# Patient Record
Sex: Female | Born: 1980 | Race: Black or African American | Hispanic: No | Smoking: Former smoker
Health system: Southern US, Community
[De-identification: ages and names within clinical notes are randomized; demographics above are authoritative.]

## PROBLEM LIST (undated history)

## (undated) ENCOUNTER — Inpatient Hospital Stay (HOSPITAL_COMMUNITY): Payer: Self-pay

## (undated) DIAGNOSIS — F329 Major depressive disorder, single episode, unspecified: Secondary | ICD-10-CM

## (undated) DIAGNOSIS — R87619 Unspecified abnormal cytological findings in specimens from cervix uteri: Secondary | ICD-10-CM

## (undated) DIAGNOSIS — N83209 Unspecified ovarian cyst, unspecified side: Secondary | ICD-10-CM

## (undated) DIAGNOSIS — F32A Depression, unspecified: Secondary | ICD-10-CM

## (undated) DIAGNOSIS — IMO0002 Reserved for concepts with insufficient information to code with codable children: Secondary | ICD-10-CM

## (undated) DIAGNOSIS — O009 Unspecified ectopic pregnancy without intrauterine pregnancy: Secondary | ICD-10-CM

## (undated) HISTORY — PX: DILATION AND CURETTAGE OF UTERUS: SHX78

## (undated) HISTORY — PX: APPENDECTOMY: SHX54

## (undated) HISTORY — PX: LAPAROTOMY: SHX154

## (undated) HISTORY — PX: WISDOM TOOTH EXTRACTION: SHX21

## (undated) HISTORY — PX: HAND SURGERY: SHX662

## (undated) HISTORY — PX: INDUCED ABORTION: SHX677

---
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
## 2007-04-08 ENCOUNTER — Emergency Department (HOSPITAL_COMMUNITY): Admission: EM | Admit: 2007-04-08 | Discharge: 2007-04-08 | Payer: Self-pay | Admitting: Emergency Medicine

## 2007-10-20 ENCOUNTER — Emergency Department (HOSPITAL_COMMUNITY): Admission: EM | Admit: 2007-10-20 | Discharge: 2007-10-20 | Payer: Self-pay | Admitting: Emergency Medicine

## 2009-07-19 ENCOUNTER — Emergency Department (HOSPITAL_COMMUNITY): Admission: EM | Admit: 2009-07-19 | Discharge: 2009-07-19 | Payer: Self-pay | Admitting: Emergency Medicine

## 2009-07-22 ENCOUNTER — Emergency Department (HOSPITAL_BASED_OUTPATIENT_CLINIC_OR_DEPARTMENT_OTHER): Admission: EM | Admit: 2009-07-22 | Discharge: 2009-07-22 | Payer: Self-pay | Admitting: Emergency Medicine

## 2009-07-22 ENCOUNTER — Ambulatory Visit: Payer: Self-pay | Admitting: Interventional Radiology

## 2010-01-24 ENCOUNTER — Ambulatory Visit (HOSPITAL_BASED_OUTPATIENT_CLINIC_OR_DEPARTMENT_OTHER)
Admission: RE | Admit: 2010-01-24 | Discharge: 2010-01-24 | Payer: Self-pay | Source: Home / Self Care | Admitting: Family Medicine

## 2010-01-24 ENCOUNTER — Ambulatory Visit: Payer: Self-pay | Admitting: Diagnostic Radiology

## 2011-03-13 LAB — POCT URINALYSIS DIP (DEVICE)
Nitrite: NEGATIVE
Urobilinogen, UA: 0.2
pH: 5.5

## 2011-03-13 LAB — URINE CULTURE

## 2011-03-13 LAB — WET PREP, GENITAL
Trich, Wet Prep: NONE SEEN
Yeast Wet Prep HPF POC: NONE SEEN

## 2012-04-20 ENCOUNTER — Inpatient Hospital Stay (HOSPITAL_COMMUNITY)
Admission: AD | Admit: 2012-04-20 | Discharge: 2012-04-20 | Disposition: A | Discharge status: Home / Self Care | Payer: Medicaid Other | Source: Ambulatory Visit | Attending: Obstetrics and Gynecology | Admitting: Obstetrics and Gynecology

## 2012-04-20 ENCOUNTER — Encounter (HOSPITAL_COMMUNITY): Payer: Self-pay | Admitting: *Deleted

## 2012-04-20 ENCOUNTER — Inpatient Hospital Stay (HOSPITAL_COMMUNITY): Payer: Medicaid Other

## 2012-04-20 DIAGNOSIS — Z3490 Encounter for supervision of normal pregnancy, unspecified, unspecified trimester: Secondary | ICD-10-CM

## 2012-04-20 DIAGNOSIS — R109 Unspecified abdominal pain: Secondary | ICD-10-CM | POA: Insufficient documentation

## 2012-04-20 DIAGNOSIS — O99891 Other specified diseases and conditions complicating pregnancy: Secondary | ICD-10-CM | POA: Insufficient documentation

## 2012-04-20 DIAGNOSIS — R1013 Epigastric pain: Secondary | ICD-10-CM

## 2012-04-20 HISTORY — DX: Unspecified ovarian cyst, unspecified side: N83.209

## 2012-04-20 HISTORY — DX: Unspecified abnormal cytological findings in specimens from cervix uteri: R87.619

## 2012-04-20 HISTORY — DX: Reserved for concepts with insufficient information to code with codable children: IMO0002

## 2012-04-20 HISTORY — DX: Unspecified ectopic pregnancy without intrauterine pregnancy: O00.90

## 2012-04-20 HISTORY — DX: Major depressive disorder, single episode, unspecified: F32.9

## 2012-04-20 HISTORY — DX: Depression, unspecified: F32.A

## 2012-04-20 LAB — URINALYSIS, ROUTINE W REFLEX MICROSCOPIC
Glucose, UA: NEGATIVE mg/dL
Leukocytes, UA: NEGATIVE
Protein, ur: NEGATIVE mg/dL
pH: 8.5 — ABNORMAL HIGH (ref 5.0–8.0)

## 2012-04-20 LAB — URINE MICROSCOPIC-ADD ON

## 2012-04-20 LAB — POCT PREGNANCY, URINE: Preg Test, Ur: POSITIVE — AB

## 2012-04-20 LAB — CBC
MCV: 71.2 fL — ABNORMAL LOW (ref 78.0–100.0)
WBC: 6.3 10*3/uL (ref 4.0–10.5)

## 2012-04-20 MED ORDER — RANITIDINE HCL 150 MG PO CAPS: 150.0000 mg | ORAL_CAPSULE | Freq: Every day | ORAL | Status: DC

## 2012-04-20 MED ORDER — GI COCKTAIL ~~LOC~~
30.0000 mL | Freq: Once | ORAL | Status: AC
  Administered 2012-04-20: 30 mL via ORAL
  Filled 2012-04-20: qty 30

## 2012-04-20 NOTE — MAU Provider Note (Signed)
Attestation of Attending Supervision of Advanced Practitioner: Evaluation and management procedures were performed by the PA/NP/CNM/OB Fellow under my supervision/collaboration. Chart reviewed and agree with management and plan.  Cora Brierley V 04/20/2012 10:33 PM

## 2012-04-20 NOTE — MAU Note (Signed)
Upper abd pain, started a few days ago.  Not always related to eating; sometimes wakes her up..  Pain is making her nauseated.

## 2012-04-20 NOTE — MAU Provider Note (Signed)
History     CSN: [id number]  Arrival date and time: 04/20/12 1535   First Provider Initiated Contact with Patient 04/20/12 1907      Chief Complaint  Patient presents with  . Abdominal Pain   HPI Christina Greene is a 31 y.o. [phone] at [redacted]w[redacted]d. She presents with c/o upper mid abd pain x 3 days. Pain last ~ 5 seconds, occurs every 3-5 minutes. Has nausea with it, it wakes her up early am. She vomited x 1 last evening due to the pain.   No other c/o voiced.    Past Medical History  Diagnosis Date  . Ectopic pregnancy   . Ovarian cyst   . Abnormal Pap smear     colpo ok  . Depression     doing ok    Past Surgical History  Procedure Date  . Dilation and curettage of uterus   . Laparotomy     ruptured ectopic, rt tube; ovarian cyst    Family History  Problem Relation Age of Onset  . Hypertension Mother   . Hypothyroidism Mother   . Diabetes Father   . Liver disease Father     hepatitis  . Other Neg Hx     History  Substance Use Topics  . Smoking status: Current Every Day Smoker -- 4 years  . Smokeless tobacco: Not on file     Comment: black and milds  . Alcohol Use: Yes     Comment: occ    Allergies: No Known Allergies  Prescriptions prior to admission  Medication Sig Dispense Refill  . aluminum & magnesium hydroxide-simethicone (MYLANTA) 500-450-40 MG/5ML suspension Take 5 mLs by mouth every 6 (six) hours as needed. Heart burn        Review of Systems  Constitutional: Negative for fever and chills.  Respiratory: Negative for cough.   Cardiovascular: Negative for chest pain.  Gastrointestinal: Positive for nausea, vomiting and abdominal pain. Negative for diarrhea and constipation.  Genitourinary: Negative for dysuria, urgency and frequency.   Physical Exam   Blood pressure 126/81, pulse 92, temperature 98.6 F (37 C), temperature source Oral, resp. rate 20, height 5\' 6"  (1.676 m), weight 176 lb (79.833 kg), last menstrual period  03/14/2012.  Physical Exam  Constitutional: She is oriented to person, place, and time. She appears well-developed and well-nourished.  GI: Soft. She exhibits no distension and no mass. There is tenderness. There is no rebound and no guarding.       Mild epigastric pain  Musculoskeletal: Normal range of motion.  Neurological: She is alert and oriented to person, place, and time.  Skin: Skin is warm and dry.  Psychiatric: She has a normal mood and affect. Her behavior is normal.    MAU Course  Procedures  MDM Results for orders placed during the hospital encounter of 04/20/12 (from the past 24 hour(s))  URINALYSIS, ROUTINE W REFLEX MICROSCOPIC     Status: Abnormal   Collection Time   04/20/12  2:20 PM      Component Value Range   Color, Urine YELLOW  YELLOW   APPearance CLEAR  CLEAR   Specific Gravity, Urine 1.015  1.005 - 1.030   pH 8.5 (*) 5.0 - 8.0   Glucose, UA NEGATIVE  NEGATIVE mg/dL   Hgb urine dipstick TRACE (*) NEGATIVE   Bilirubin Urine NEGATIVE  NEGATIVE   Ketones, ur NEGATIVE  NEGATIVE mg/dL   Protein, ur NEGATIVE  NEGATIVE mg/dL   Urobilinogen, UA 0.2  0.0 - 1.0  mg/dL   Nitrite NEGATIVE  NEGATIVE   Leukocytes, UA NEGATIVE  NEGATIVE  URINE MICROSCOPIC-ADD ON     Status: Abnormal   Collection Time   04/20/12  2:20 PM      Component Value Range   Squamous Epithelial / LPF FEW (*) RARE   WBC, UA 0-2  <3 WBC/hpf   RBC / HPF 0-2  <3 RBC/hpf   Bacteria, UA FEW (*) RARE  POCT PREGNANCY, URINE     Status: Abnormal   Collection Time   04/20/12  4:37 PM      Component Value Range   Preg Test, Ur POSITIVE (*) NEGATIVE  ABO/RH     Status: Normal   Collection Time   04/20/12  4:58 PM      Component Value Range   ABO/RH(D) O POS    HCG, QUANTITATIVE, PREGNANCY     Status: Abnormal   Collection Time   04/20/12  4:58 PM      Component Value Range   hCG, Beta Chain, Quant, S 4484 (*) <5 mIU/mL  CBC     Status: Abnormal   Collection Time   04/20/12  4:59 PM       Component Value Range   WBC 6.3  4.0 - 10.5 K/uL   RBC 5.52 (*) 3.87 - 5.11 MIL/uL   Hemoglobin 12.9  12.0 - 15.0 g/dL   HCT 16.1  09.6 - 04.5 %   MCV 71.2 (*) 78.0 - 100.0 fL   MCH 23.4 (*) 26.0 - 34.0 pg   MCHC 32.8  30.0 - 36.0 g/dL   RDW 40.9  81.1 - 91.4 %   Platelets 238  150 - 400 K/uL   US Ob Comp Less 14 Wks  04/20/2012  *RADIOLOGY REPORT*  Clinical Data: Pregnant, abdominal pain, spotting, left lower quadrant pain, past history of ovarian cysts and ectopic pregnancy  OBSTETRIC <14 WK [place] AND TRANSVAGINAL OB US  Technique:  Both transabdominal and transvaginal ultrasound examinations were performed for complete evaluation of the gestation as well as the maternal uterus, adnexal regions, and pelvic cul-de-sac.  Transvaginal technique was performed to assess early pregnancy.  Comparison:  None  Intrauterine gestational sac:  Present, irregular Yolk sac: Present Embryo: Not identified Cardiac Activity: Not identified Heart Rate: N/A bpm  MSD: 6.3  mm  5 w 1 d        [place] EDC: 12/20/2012  Maternal uterus/adnexae: Small subchorionic hemorrhage 5 x 6 x 6 mm. Left ovary normal size and morphology 2.3 x 3.6 x 2.4 cm, containing a small corpus luteum. Right ovary normal size and morphology, 3.0 x 1.6 x 2.5 cm. No adnexal masses or free pelvic fluid.  IMPRESSION: Early intrauterine gestational sac identified with mean sac diameter corresponding to  5 weeks 1 day EGA. A yolk sac is visualized but no fetal pole is seen. Small subchorionic hemorrhage. Follow-up sonography recommended in 10-14 days to establish fetal viability.   Original Report Authenticated By: Ulyses Southward, M.D.    US Ob Transvaginal  04/20/2012  *RADIOLOGY REPORT*  Clinical Data: Pregnant, abdominal pain, spotting, left lower quadrant pain, past history of ovarian cysts and ectopic pregnancy  OBSTETRIC <14 WK [place] AND TRANSVAGINAL OB US  Technique:  Both transabdominal and transvaginal ultrasound examinations were performed for complete  evaluation of the gestation as well as the maternal uterus, adnexal regions, and pelvic cul-de-sac.  Transvaginal technique was performed to assess early pregnancy.  Comparison:  None  Intrauterine gestational sac:  Present, irregular Yolk sac: Present Embryo: Not identified Cardiac Activity: Not identified Heart Rate: N/A bpm  MSD: 6.3  mm  5 w 1 d        [place] EDC: 12/20/2012  Maternal uterus/adnexae: Small subchorionic hemorrhage 5 x 6 x 6 mm. Left ovary normal size and morphology 2.3 x 3.6 x 2.4 cm, containing a small corpus luteum. Right ovary normal size and morphology, 3.0 x 1.6 x 2.5 cm. No adnexal masses or free pelvic fluid.  IMPRESSION: Early intrauterine gestational sac identified with mean sac diameter corresponding to  5 weeks 1 day EGA. A yolk sac is visualized but no fetal pole is seen. Small subchorionic hemorrhage. Follow-up sonography recommended in 10-14 days to establish fetal viability.   Original Report Authenticated By: Ulyses Southward, M.D.      Assessment and Plan  1-5 1/7 wk IUGS with yolk sac, pt to start prenatal care. 2-Epigastric pain, trial Zantac 150 BID. Pt states has low fat diet. Advised no spicey, greasy, fatty foods. To return if symptoms increase.      Rachel Samples, Olegario Messier M. 04/20/2012, 7:10 PM

## 2012-04-20 NOTE — MAU Note (Addendum)
Pt reports having an real bad epigastric pain that comes and goes x3 days. Pt went to [facility] on Wed. They told jher she had and ovairan cyst and there was no fetal HR at that time. Pt is only [redacted] weeks pregnant

## 2012-05-28 ENCOUNTER — Inpatient Hospital Stay (HOSPITAL_COMMUNITY)
Admission: AD | Admit: 2012-05-28 | Discharge: 2012-05-28 | Disposition: A | Discharge status: Home / Self Care | Payer: Medicaid Other | Source: Ambulatory Visit | Attending: Obstetrics & Gynecology | Admitting: Obstetrics & Gynecology

## 2012-05-28 ENCOUNTER — Inpatient Hospital Stay (HOSPITAL_COMMUNITY): Payer: Medicaid Other

## 2012-05-28 ENCOUNTER — Encounter (HOSPITAL_COMMUNITY): Payer: Self-pay

## 2012-05-28 DIAGNOSIS — O034 Incomplete spontaneous abortion without complication: Secondary | ICD-10-CM | POA: Insufficient documentation

## 2012-05-28 DIAGNOSIS — R109 Unspecified abdominal pain: Secondary | ICD-10-CM | POA: Insufficient documentation

## 2012-05-28 MED ORDER — OXYCODONE-ACETAMINOPHEN 5-325 MG PO TABS
2.0000 | ORAL_TABLET | ORAL | Status: AC
  Administered 2012-05-28: 2 via ORAL
  Filled 2012-05-28: qty 2

## 2012-05-28 MED ORDER — OXYCODONE-ACETAMINOPHEN 5-325 MG PO TABS: 1.0000 | ORAL_TABLET | Freq: Four times a day (QID) | ORAL | Status: DC | PRN

## 2012-05-28 MED ORDER — IBUPROFEN 600 MG PO TABS
600.0000 mg | ORAL_TABLET | ORAL | Status: AC
  Administered 2012-05-28: 600 mg via ORAL
  Filled 2012-05-28: qty 1

## 2012-05-28 MED ORDER — IBUPROFEN 600 MG PO TABS
600.0000 mg | ORAL_TABLET | Freq: Four times a day (QID) | ORAL | Status: DC | PRN
Start: 2012-05-28 — End: 2012-05-28

## 2012-05-28 MED ORDER — IBUPROFEN 600 MG PO TABS: 600.0000 mg | ORAL_TABLET | Freq: Four times a day (QID) | ORAL | Status: DC | PRN

## 2012-05-28 MED ORDER — HYDROMORPHONE HCL PF 1 MG/ML IJ SOLN
INTRAMUSCULAR | Status: AC
  Administered 2012-05-28: 1 mg via INTRAMUSCULAR
  Filled 2012-05-28: qty 1

## 2012-05-28 MED ORDER — MISOPROSTOL 200 MCG PO TABS
800.0000 ug | ORAL_TABLET | ORAL | Status: AC
  Administered 2012-05-28: 800 ug via VAGINAL
  Filled 2012-05-28: qty 4

## 2012-05-28 MED ORDER — HYDROMORPHONE HCL PF 1 MG/ML IJ SOLN
1.0000 mg | INTRAMUSCULAR | Status: AC
  Administered 2012-05-28: 1 mg via INTRAMUSCULAR

## 2012-05-28 NOTE — MAU Note (Signed)
Bright red bleeding with small clots since Sunday night. Worse this morning. Cramping since Monday but worse tonight.

## 2012-05-28 NOTE — MAU Provider Note (Signed)
Chief Complaint: Vaginal Bleeding and Abdominal Pain   None    SUBJECTIVE HPI: Christina Greene is a 31 y.o. [phone] at 102w5d by LMP who presents to maternity admissions reporting cramping abdominal pain and heavy vaginal bleeding with onset yesterday but becoming heavier early this morning.  She denies vaginal itching/burning, urinary symptoms, h/a, dizziness, n/v, or fever/chills.     Past Medical History  Diagnosis Date  . Ectopic pregnancy   . Ovarian cyst   . Abnormal Pap smear     colpo ok  . Depression     doing ok   Past Surgical History  Procedure Date  . Dilation and curettage of uterus   . Laparotomy     ruptured ectopic, rt tube; ovarian cyst   History   Social History  . Marital Status: Legally Separated    Spouse Name: N/A    Number of Children: N/A  . Years of Education: N/A   Occupational History  . Not on file.   Social History Main Topics  . Smoking status: Current Every Day Smoker -- 4 years  . Smokeless tobacco: Not on file     Comment: black and milds  . Alcohol Use: Yes     Comment: occ  . Drug Use:   . Sexually Active: Yes   Other Topics Concern  . Not on file   Social History Narrative  . No narrative on file   No current facility-administered medications on file prior to encounter.   No current outpatient prescriptions on file prior to encounter.   No Known Allergies  ROS: Pertinent items in HPI  OBJECTIVE Blood pressure 122/66, pulse 88, resp. rate 20, last menstrual period 03/14/2012. GENERAL: Well-developed, well-nourished female in no acute distress.  HEENT: Normocephalic HEART: normal rate RESP: normal effort ABDOMEN: Soft, non-tender EXTREMITIES: Nontender, no edema NEURO: Alert and oriented Pelvic exam: Cervix pink, visually closed, without lesion, large amount dark red blood in vault, vaginal walls and external genitalia normal Bimanual exam: Cervix 0/long/high, firm, anterior, neg CMT, uterus nontender, slightly  enlarged, adnexa without tenderness, enlargement, or mass  LAB RESULTS Previous lab results from 11/13 indicate O positive blood type and Hemoglobin above 12  IMAGING US Ob Transvaginal  05/28/2012  *RADIOLOGY REPORT*  Clinical Data: Vaginal bleeding and pelvic pain.  TRANSVAGINAL OBSTETRIC US  Technique:  Transvaginal ultrasound was performed for complete evaluation of the gestation as well as the maternal uterus, adnexal regions, and pelvic cul-de-sac.  Comparison:  Prior ultrasound of pregnancy performed 04/20/2012  Intrauterine gestational sac: Single sac visualized; normal in appearance. Yolk sac: No Embryo: Yes Cardiac Activity: No Heart Rate: N/A  CRL: 2.2 cm           8   w  6   d  Subchorionic hemorrhage: A small amount of subchorionic hemorrhage is noted.  Maternal uterus/adnexae: The uterus demonstrates multiple synechiae.  It is otherwise unremarkable in appearance.  The ovaries are within normal limits.  The right ovary measures 3.3 x 1.9 x 2.5 cm, while the left ovary measures 3.4 x 1.9 x 2.3 cm. No suspicious adnexal masses are seen; there is no evidence for ovarian torsion.  No free fluid is seen within the pelvic cul-de-sac.  IMPRESSION:  1.  No cardiac activity visualized; the embryo measures 2.2 cm in crown-rump length.  This is compatible with missed spontaneous abortion.  Small amount of subchorionic hemorrhage noted. 2.  Note made of multiple uterine synechiae.   Original Report Authenticated By:  Tonia Ghent, M.D.     ASSESSMENT 1. Incomplete miscarriage     PLAN Discussed options with pt including expectant management, follow up in Gyn clinic, or Cytotec today.  Pt prefers Cytotec today.   Cytotec 800 mg placed vaginally Percocet 5/325 x2 tabs and 600 mg ibuprofen given in MAU Discharge home Percocet x15 tabs and ibuprofen 600 mg x30 tabs prescribed F/U in Gyn clinic in 1 week Return to MAU as needed    Medication List     As of 05/28/2012  8:20 AM    TAKE these  medications         ibuprofen 600 MG tablet   Commonly known as: ADVIL,MOTRIN   Take 1 tablet (600 mg total) by mouth every 6 (six) hours as needed for pain.      oxyCODONE-acetaminophen 5-325 MG per tablet   Commonly known as: PERCOCET/ROXICET   Take 1-2 tablets by mouth every 6 (six) hours as needed for pain.           Follow-up Information    Follow up with [facility]. (The Gyn clinic will call you to make an appointment, or call the number listed below.  Return to MAU as needed.)    Contact information:   [address] [place] [address] [phone]         Sharen Counter Certified Nurse-Midwife 05/28/2012  8:20 AM

## 2012-05-29 NOTE — MAU Provider Note (Signed)

## 2012-06-18 ENCOUNTER — Other Ambulatory Visit (HOSPITAL_COMMUNITY)
Admission: RE | Admit: 2012-06-18 | Discharge: 2012-06-18 | Disposition: A | Discharge status: Home / Self Care | Payer: Medicaid Other | Source: Ambulatory Visit | Attending: Family Medicine | Admitting: Family Medicine

## 2012-06-18 ENCOUNTER — Ambulatory Visit (INDEPENDENT_AMBULATORY_CARE_PROVIDER_SITE_OTHER): Payer: Medicaid Other | Admitting: Family Medicine

## 2012-06-18 ENCOUNTER — Encounter: Payer: Self-pay | Admitting: Family Medicine

## 2012-06-18 VITALS — BP 131/83 | HR 100 | Temp 97.3°F | Resp 20 | Ht 66.0 in | Wt 180.2 lb

## 2012-06-18 DIAGNOSIS — N760 Acute vaginitis: Secondary | ICD-10-CM | POA: Insufficient documentation

## 2012-06-18 DIAGNOSIS — O039 Complete or unspecified spontaneous abortion without complication: Secondary | ICD-10-CM

## 2012-06-18 DIAGNOSIS — N898 Other specified noninflammatory disorders of vagina: Secondary | ICD-10-CM

## 2012-06-18 NOTE — Patient Instructions (Signed)
Miscarriage A miscarriage is the sudden loss of an unborn baby (fetus) before the 20th week of pregnancy. Most miscarriages happen in the first 3 months of pregnancy. Sometimes, it happens before a woman even knows she is pregnant. A miscarriage is also called a "spontaneous miscarriage" or "early pregnancy loss." Having a miscarriage can be an emotional experience. Talk with your caregiver about any questions you may have about miscarrying, the grieving process, and your future pregnancy plans. CAUSES   Problems with the fetal chromosomes that make it impossible for the baby to develop normally. Problems with the baby's genes or chromosomes are most often the result of errors that occur, by chance, as the embryo divides and grows. The problems are not inherited from the parents.  Infection of the cervix or uterus.   Hormone problems.   Problems with the cervix, such as having an incompetent cervix. This is when the tissue in the cervix is not strong enough to hold the pregnancy.   Problems with the uterus, such as an abnormally shaped uterus, uterine fibroids, or congenital abnormalities.   Certain medical conditions.   Smoking, drinking alcohol, or taking illegal drugs.   Trauma.  Often, the cause of a miscarriage is unknown.  SYMPTOMS   Vaginal bleeding or spotting, with or without cramps or pain.  Pain or cramping in the abdomen or lower back.  Passing fluid, tissue, or blood clots from the vagina. DIAGNOSIS  Your caregiver will perform a physical exam. You may also have an ultrasound to confirm the miscarriage. Blood or urine tests may also be ordered. TREATMENT   Sometimes, treatment is not necessary if you naturally pass all the fetal tissue that was in the uterus. If some of the fetus or placenta remains in the body (incomplete miscarriage), tissue left behind may become infected and must be removed. Usually, a dilation and curettage (D and C) procedure is performed.  During a D and C procedure, the cervix is widened (dilated) and any remaining fetal or placental tissue is gently removed from the uterus.  Antibiotic medicines are prescribed if there is an infection. Other medicines may be given to reduce the size of the uterus (contract) if there is a lot of bleeding.  If you have Rh negative blood and your baby was Rh positive, you will need a Rh immunoglobulin shot. This shot will protect any future baby from having Rh blood problems in future pregnancies. HOME CARE INSTRUCTIONS   Your caregiver may order bed rest or may allow you to continue light activity. Resume activity as directed by your caregiver.  Have someone help with home and family responsibilities during this time.   Keep track of the number of sanitary pads you use each day and how soaked (saturated) they are. Write down this information.   Do not use tampons. Do not douche or have sexual intercourse until approved by your caregiver.   Only take over-the-counter or prescription medicines for pain or discomfort as directed by your caregiver.   Do not take aspirin. Aspirin can cause bleeding.   Keep all follow-up appointments with your caregiver.   If you or your partner have problems with grieving, talk to your caregiver or seek counseling to help cope with the pregnancy loss. Allow enough time to grieve before trying to get pregnant again.  SEEK IMMEDIATE MEDICAL CARE IF:   You have severe cramps or pain in your back or abdomen.  You have a fever.  You pass large blood clots (walnut-sized   or larger) ortissue from your vagina. Save any tissue for your caregiver to inspect.   Your bleeding increases.   You have a thick, bad-smelling vaginal discharge.  You become lightheaded, weak, or you faint.   You have chills.  MAKE SURE YOU:  Understand these instructions.  Will watch your condition.  Will get help right away if you are not doing well or get  worse. Document Released: 11/14/2000 Document Revised: 11/20/2011 Document Reviewed: 07/10/2011 [facility] Patient Information 2013 [place], [place].   Preparing for Pregnancy Preparing for pregnancy (preconceptual care) by getting counseling and information from your caregiver before getting pregnant is a good idea. It will help you and your baby have a better chance to have a healthy, safe pregnancy and delivery of your baby. Make an appointment with your caregiver to talk about your health, medical, and family history and how to prepare yourself before getting pregnant. Your caregiver will do a complete physical exam and a Pap test. They will want to know:  About you, your spouse or partner, and your family's medical and genetic history.  If you are eating a balanced diet and drinking enough fluids.  What vitamins and mineral supplements you are taking. This includes taking folic acid before getting pregnant to help prevent birth defects.  What medications you are taking including prescription, over-the-counter and herbal medications.  If there is any substance abuse like alcohol, smoking, and illegal drugs.  If there is any mental or physical domestic violence.  If there is any risk of sexually transmitted disease between you and your partner.  What immunizations and vaccinations you have had and what you may need before getting pregnant.  If you should get tested for HIV infection.  If there is any exposure to chemical or toxic substances at home or work.  If there are medical problems you have that need to be treated and kept under control before getting pregnant such as diabetes, high blood pressure or others.  If there were any past surgeries, pregnancies and problems with them.  What your current weight is and to set a goal as to how much weight you should gain while pregnant. Also, they will check if you should lose or gain weight before getting pregnant.  What is your  exercise routine and what it is safe when you are pregnant.  If there are any physical disabilities that need to be addressed.  About spacing your pregnancies when there are other children.  If there is a financial problem that may affect you having a child. After talking about the above points with your caregiver, your caregiver will give you advice on how to help treat and work with you on solving any issues, if necessary, before getting pregnant. The goal is to have a healthy and safe pregnancy for you and your baby. You should keep an accurate record of your menstrual periods because it will help in determining your due date. Immunizations that you should have before getting pregnant:   Regular measles, [place] measles (rubella) and mumps.  Tetanus and diphtheria.  Chickenpox, if not immune.  Herpes zoster (Varicella) if not immune.  Human papilloma virus vaccine (HPV) between the age of 29 and 57 years old.  Hepatitis A vaccine.  Hepatitis B vaccine.  Influenza vaccine.  Pneumococcal vaccine (pneumonia). You should avoid getting pregnant for one month after getting vaccinated with a live virus vaccine such as [place] measles (rubella) vaccine. Other immunizations may be necessary depending on where you live, such  as malaria. Ask your caregiver if any other immunizations are needed for you. HOME CARE INSTRUCTIONS   Follow the advice of your caregiver.  Before getting pregnant:  Begin taking vitamins, supplements, and 0.4 milligrams folic acid daily.  Get your immunizations up-to-date.  Get help from a nutrition counselor if you do not understand what a balanced diet is, need help with a special medical diet or if you need help to lose or gain weight.  Begin exercising.  Stop smoking, taking illegal drugs, and drinking alcoholic beverages.  Get counseling if there is and type of domestic violence.  Get checked for sexually transmitted diseases including HIV.  Get any  medical problems under control (diabetes, high blood pressure, convulsions, asthma or others).  Resolve any financial concerns.  Be sure you and your spouse or partner are ready to have a baby.  Keep an accurate record of your menstrual periods. Document Released: 05/03/2008 Document Revised: 08/13/2011 Document Reviewed: 05/03/2008 [facility] Patient Information 2013 [place], [place].  Bacterial Vaginosis Bacterial vaginosis (BV) is a vaginal infection where the normal balance of bacteria in the vagina is disrupted. The normal balance is then replaced by an overgrowth of certain bacteria. There are several different kinds of bacteria that can cause BV. BV is the most common vaginal infection in women of childbearing age. CAUSES   The cause of BV is not fully understood. BV develops when there is an increase or imbalance of harmful bacteria.  Some activities or behaviors can upset the normal balance of bacteria in the vagina and put women at increased risk including:  Having a new sex partner or multiple sex partners.  Douching.  Using an intrauterine device (IUD) for contraception.  It is not clear what role sexual activity plays in the development of BV. However, women that have never had sexual intercourse are rarely infected with BV. Women do not get BV from toilet seats, bedding, swimming pools or from touching objects around them.  SYMPTOMS   Grey vaginal discharge.  A fish-like odor with discharge, especially after sexual intercourse.  Itching or burning of the vagina and vulva.  Burning or pain with urination.  Some women have no signs or symptoms at all. DIAGNOSIS  Your caregiver must examine the vagina for signs of BV. Your caregiver will perform lab tests and look at the sample of vaginal fluid through a microscope. They will look for bacteria and abnormal cells (clue cells), a pH test higher than 4.5, and a positive amine test all associated with BV.  RISKS AND  COMPLICATIONS   Pelvic inflammatory disease (PID).  Infections following gynecology surgery.  Developing HIV.  Developing herpes virus. TREATMENT  Sometimes BV will clear up without treatment. However, all women with symptoms of BV should be treated to avoid complications, especially if gynecology surgery is planned. Female partners generally do not need to be treated. However, BV may spread between female sex partners so treatment is helpful in preventing a recurrence of BV.   BV may be treated with antibiotics. The antibiotics come in either pill or vaginal cream forms. Either can be used with nonpregnant or pregnant women, but the recommended dosages differ. These antibiotics are not harmful to the baby.  BV can recur after treatment. If this happens, a second round of antibiotics will often be prescribed.  Treatment is important for pregnant women. If not treated, BV can cause a premature delivery, especially for a pregnant woman who had a premature birth in the past. All pregnant  women who have symptoms of BV should be checked and treated.  For chronic reoccurrence of BV, treatment with a type of prescribed gel vaginally twice a week is helpful. HOME CARE INSTRUCTIONS   Finish all medication as directed by your caregiver.  Do not have sex until treatment is completed.  Tell your sexual partner that you have a vaginal infection. They should see their caregiver and be treated if they have problems, such as a mild rash or itching.  Practice safe sex. Use condoms. Only have 1 sex partner. PREVENTION  Basic prevention steps can help reduce the risk of upsetting the natural balance of bacteria in the vagina and developing BV:  Do not have sexual intercourse (be abstinent).  Do not douche.  Use all of the medicine prescribed for treatment of BV, even if the signs and symptoms go away.  Tell your sex partner if you have BV. That way, they can be treated, if needed, to prevent  reoccurrence. SEEK MEDICAL CARE IF:   Your symptoms are not improving after 3 days of treatment.  You have increased discharge, pain, or fever. MAKE SURE YOU:   Understand these instructions.  Will watch your condition.  Will get help right away if you are not doing well or get worse. FOR MORE INFORMATION  Division of STD Prevention (DSTDP), Centers for Disease Control and Prevention: [employer] American Social Health Association (ASHA): www.ashastd.org  Document Released: 05/21/2005 Document Revised: 08/13/2011 Document Reviewed: 11/11/2008 [facility] Patient Information 2013 [place], [place].

## 2012-06-18 NOTE — Progress Notes (Signed)
Pt states she continues to have abdominal pain- ibuprofen not working.

## 2012-06-18 NOTE — Progress Notes (Signed)
Patient ID: Christina Greene, female   DOB: 1981/02/05, 32 y.o.   MRN: [id number]  HPI:  Pt is a 32 y.o. [id number] who was seen in [facility] on 12/25 with incomplete/threatened abortion and was given cytotec per vagina. She states that she had heavy bleeding and cramping for 1.5 days after cytotec, passing clots but no obvious tissue. The cramping diminished, but bleeding continued through  06/08/12. Today she is not bleeding or cramping. She does have some vaginal itching and discharge. She has been having some sharp/achy pain in LLQ for a few days and worried about cyst or ectopic pregnancy. She has had unprotected intercourse since the miscarriage. She was seen in [place] in November when she found out she was pregnant and told she had a cyst on the left. Reviewing the imaging at that time, patient had a small corpus luteum. Repeat ultrasound on 12/25 showed normal ovaries.   GYN History: menarche age 15, regular, moderate bleeding. Last pap November 2012, normal. Had colposcopy as a teenager, normal and another colposcopy about 3 years ago that was negative. LMP in October.  OB History: [id number] G1 - 12 yrs ago, early SAB  G2 - 10 yrs ago, SVD, living child  G3 - ectopic, 9 yrs ago, laparascopic sugery with R salpingectomy and partial oophorectomy (per patient) G4 - most recent, SAB  Desires to have more children. Was on OCPs for years between pregnancies until August 2013, stopped then desiring pregnancy.  Past Medical History  Diagnosis Date  . Ectopic pregnancy   . Ovarian cyst   . Abnormal Pap smear     colpo ok  . Depression     doing ok   Past Surgical History  Procedure Date  . Dilation and curettage of uterus   . Laparotomy     ruptured ectopic, rt tube; ovarian cyst  . Wisdom tooth extraction   . Nasal fracture surgery 2002   No Known Allergies  SOCIAL HISTORY Smokes cigars occasionally (1-2 a week) Occasional alcohol No other drug use.  REVIEW OF SYSTEMS Const:  No  fever/chills/sweats HEENT:  No headache or vision changes. Cardio:  No chest pain, shortness of breath or palpitations GI:  No nausea/vomiting/diarrhea GU:  See HPI Neuro:  No dizziness or lightheadedness   PHYSICAL EXAM Filed Vitals:   06/18/12 1339  BP: 131/83  Pulse: 100  Temp: 97.3 F (36.3 C)  Resp: 20    GEN:  WNWD, no distress HEENT:  NCAT, EOMI NECK:  Trachea midline, supple, normal ROM CV:  RRR, no murmur LUNGS:  CTAB ABD:  Mild LLQ tenderness, normal bowel sounds GU:  Normal external genitalia, normal vagina with moderate creamy white discharge EXTREM:  No edema or tenderness  ASSESSMENT/PLAN 32 y.o. [id number] not currently pregnant with 1.   Complete miscarriage. No further bleeding. Recommend protected intercourse until normal menstruation has resumed and waiting 2 to 3 cycles before attempting to conceive. Pt declines contraception today. 2.  Preconception counseling:  Recommended patient start prenatal vitamins with folic acid supplement, stop smoking and alcohol and limit OTC meds to tylenol as needed. 3. LLQ pain, likely ovarian follicle. No evidence of cyst on 12/25/ ultrasound. Pt advised to return if pain persists after next menses or if worsens.  4.  Hx ectopic pregnancy - pt advised to seek early care if she thinks she may be pregnant.  Napoleon Form, MD

## 2012-06-19 ENCOUNTER — Encounter: Payer: Self-pay | Admitting: Family Medicine

## 2012-06-25 ENCOUNTER — Telehealth: Payer: Self-pay | Admitting: *Deleted

## 2012-06-25 MED ORDER — METRONIDAZOLE 500 MG PO TABS: 500.0000 mg | ORAL_TABLET | Freq: Two times a day (BID) | ORAL | Status: DC

## 2012-06-25 NOTE — Telephone Encounter (Signed)
Patient called requesting result of wep prep. She is positive for BV and c/o thin d/c with fishy odor. Flagyl 500mg  ordered and sent to pharm on file. Patient agrees and satisfied.

## 2012-06-25 NOTE — Telephone Encounter (Signed)
Patient called requesting results.  

## 2012-11-18 ENCOUNTER — Ambulatory Visit: Payer: Medicaid Other | Attending: Orthopedic Surgery | Admitting: Occupational Therapy

## 2012-11-18 DIAGNOSIS — M25549 Pain in joints of unspecified hand: Secondary | ICD-10-CM | POA: Insufficient documentation

## 2012-11-18 DIAGNOSIS — IMO0001 Reserved for inherently not codable concepts without codable children: Secondary | ICD-10-CM | POA: Insufficient documentation

## 2012-11-18 DIAGNOSIS — M25649 Stiffness of unspecified hand, not elsewhere classified: Secondary | ICD-10-CM | POA: Insufficient documentation

## 2012-12-02 ENCOUNTER — Ambulatory Visit: Payer: Medicaid Other | Admitting: Occupational Therapy

## 2012-12-10 ENCOUNTER — Ambulatory Visit: Payer: Medicaid Other | Admitting: Occupational Therapy

## 2013-07-27 ENCOUNTER — Ambulatory Visit: Payer: Self-pay | Admitting: Advanced Practice Midwife

## 2013-07-31 ENCOUNTER — Ambulatory Visit: Payer: Self-pay | Admitting: Obstetrics & Gynecology

## 2013-08-05 ENCOUNTER — Ambulatory Visit: Payer: Medicaid Other | Admitting: Obstetrics & Gynecology

## 2013-08-13 ENCOUNTER — Ambulatory Visit (INDEPENDENT_AMBULATORY_CARE_PROVIDER_SITE_OTHER): Payer: Medicaid Other | Admitting: Obstetrics & Gynecology

## 2013-08-13 ENCOUNTER — Encounter: Payer: Self-pay | Admitting: Obstetrics & Gynecology

## 2013-08-13 VITALS — BP 134/88 | HR 83 | Temp 97.3°F | Ht 66.0 in | Wt 198.0 lb

## 2013-08-13 DIAGNOSIS — Z0000 Encounter for general adult medical examination without abnormal findings: Secondary | ICD-10-CM

## 2013-08-13 DIAGNOSIS — Z3202 Encounter for pregnancy test, result negative: Secondary | ICD-10-CM

## 2013-08-13 LAB — POCT URINE PREGNANCY: PREG TEST UR: NEGATIVE

## 2013-08-13 LAB — HEMOGLOBIN AND HEMATOCRIT, BLOOD
HCT: 40.4 % (ref 36.0–46.0)
Hemoglobin: 12.9 g/dL (ref 12.0–15.0)

## 2013-08-13 MED ORDER — METRONIDAZOLE 0.75 % VA GEL: 1.0000 | Freq: Every day | VAGINAL | Status: AC

## 2013-08-13 NOTE — Progress Notes (Signed)
Subjective:     Christina Greene is a 33 y.o. female here for a routine exam.  Current complaints: annual exam. Patient states she is having discharge with slight odor. Patient states she has also noticed a rash all over her body that itches. Patient states she has tried benadryl and states it did not help relieve the itching. Patient states she is trying to conceive. Personal health questionnaire reviewed: yes.   Gynecologic History Patient's last menstrual period was 07/24/2013. Contraception: none Last Pap: 06/2012. Results were: normal  Obstetric History OB History  Gravida Para Term Preterm AB SAB TAB Ectopic Multiple Living  4 1 1  0 3 2 0 1 0 1    # Outcome Date GA Lbr Len/2nd Weight Sex Delivery Anes PTL Lv  4 SAB 05/04/12 [id number]            Comments: System Generated. Please review and update pregnancy details.  3 ECT 2005          2 TRM 10/23/02    M SVD EPI N Y  1 SAB 2002               The following portions of the patient's history were reviewed and updated as appropriate: allergies, current medications, past family history, past medical history, past social history, past surgical history and problem list.  Review of Systems Pertinent items are noted in HPI.    Objective:    Breasts: normal appearance, no masses or tenderness Abdomen: soft, non-tender; bowel sounds normal; no masses,  no organomegaly Pelvic: cervix normal in appearance, external genitalia normal, no adnexal masses or tenderness, no cervical motion tenderness, uterus normal size, shape, and consistency and vagina normal without discharge    Assessment:    Healthy female exam.  Positive depression screen   Plan:   Orders Placed This Encounter  Procedures  . WET PREP BY MOLECULAR PROBE  . GC/Chlamydia Probe Amp  . HIV antibody  . RPR  . Hemoglobin and hematocrit, blood  . POCT urine pregnancy   Dermatologist Referral  Primary Care Physician Referral. Behavioral Health Referral.   Preconception Packet given.  Vitamins given.

## 2013-08-14 ENCOUNTER — Encounter: Payer: Self-pay | Admitting: Obstetrics & Gynecology

## 2013-08-14 LAB — GC/CHLAMYDIA PROBE AMP
CT Probe RNA: NEGATIVE
GC Probe RNA: NEGATIVE

## 2013-08-14 LAB — RPR

## 2013-08-14 LAB — WET PREP BY MOLECULAR PROBE
Candida species: NEGATIVE
GARDNERELLA VAGINALIS: POSITIVE — AB
Trichomonas vaginosis: NEGATIVE

## 2013-08-14 LAB — HIV ANTIBODY (ROUTINE TESTING W REFLEX): HIV: NONREACTIVE

## 2013-08-17 NOTE — Patient Instructions (Signed)
Health Maintenance, Female A healthy lifestyle and preventative care can promote health and wellness.  Maintain regular health, dental, and eye exams.  Eat a healthy diet. Foods like vegetables, fruits, whole grains, low-fat dairy products, and lean protein foods contain the nutrients you need without too many calories. Decrease your intake of foods high in solid fats, added sugars, and salt. Get information about a proper diet from your caregiver, if necessary.  Regular physical exercise is one of the most important things you can do for your health. Most adults should get at least 150 minutes of moderate-intensity exercise (any activity that increases your heart rate and causes you to sweat) each week. In addition, most adults need muscle-strengthening exercises on 2 or more days a week.   Maintain a healthy weight. The body mass index (BMI) is a screening tool to identify possible weight problems. It provides an estimate of body fat based on height and weight. Your caregiver can help determine your BMI, and can help you achieve or maintain a healthy weight. For adults 20 years and older:  A BMI below 18.5 is considered underweight.  A BMI of 18.5 to 24.9 is normal.  A BMI of 25 to 29.9 is considered overweight.  A BMI of 30 and above is considered obese.  Maintain normal blood lipids and cholesterol by exercising and minimizing your intake of saturated fat. Eat a balanced diet with plenty of fruits and vegetables. Blood tests for lipids and cholesterol should begin at age 20 and be repeated every 5 years. If your lipid or cholesterol levels are high, you are over 50, or you are a high risk for heart disease, you may need your cholesterol levels checked more frequently.Ongoing high lipid and cholesterol levels should be treated with medicines if diet and exercise are not effective.  If you smoke, find out from your caregiver how to quit. If you do not use tobacco, do not start.  Lung  cancer screening is recommended for adults aged 55 80 years who are at high risk for developing lung cancer because of a history of smoking. Yearly low-dose computed tomography (CT) is recommended for people who have at least a 30-pack-year history of smoking and are a current smoker or have quit within the past 15 years. A pack year of smoking is smoking an average of 1 pack of cigarettes a day for 1 year (for example: 1 pack a day for 30 years or 2 packs a day for 15 years). Yearly screening should continue until the smoker has stopped smoking for at least 15 years. Yearly screening should also be stopped for people who develop a health problem that would prevent them from having lung cancer treatment.  If you are pregnant, do not drink alcohol. If you are breastfeeding, be very cautious about drinking alcohol. If you are not pregnant and choose to drink alcohol, do not exceed 1 drink per day. One drink is considered to be 12 ounces (355 mL) of beer, 5 ounces (148 mL) of wine, or 1.5 ounces (44 mL) of liquor.  Avoid use of street drugs. Do not share needles with anyone. Ask for help if you need support or instructions about stopping the use of drugs.  High blood pressure causes heart disease and increases the risk of stroke. Blood pressure should be checked at least every 1 to 2 years. Ongoing high blood pressure should be treated with medicines, if weight loss and exercise are not effective.  If you are 55 to   31 years old, ask your caregiver if you should take aspirin to prevent strokes.  Diabetes screening involves taking a blood sample to check your fasting blood sugar level. This should be done once every 3 years, after age 45, if you are within normal weight and without risk factors for diabetes. Testing should be considered at a younger age or be carried out more frequently if you are overweight and have at least 1 risk factor for diabetes.  Breast cancer screening is essential preventative care  for women. You should practice "breast self-awareness." This means understanding the normal appearance and feel of your breasts and may include breast self-examination. Any changes detected, no matter how small, should be reported to a caregiver. Women in their 20s and 30s should have a clinical breast exam (CBE) by a caregiver as part of a regular health exam every 1 to 3 years. After age 40, women should have a CBE every year. Starting at age 40, women should consider having a mammogram (breast X-ray) every year. Women who have a family history of breast cancer should talk to their caregiver about genetic screening. Women at a high risk of breast cancer should talk to their caregiver about having an MRI and a mammogram every year.  Breast cancer gene (BRCA)-related cancer risk assessment is recommended for women who have family members with BRCA-related cancers. BRCA-related cancers include breast, ovarian, tubal, and peritoneal cancers. Having family members with these cancers may be associated with an increased risk for harmful changes (mutations) in the breast cancer genes BRCA1 and BRCA2. Results of the assessment will determine the need for genetic counseling and BRCA1 and BRCA2 testing.  The Pap test is a screening test for cervical cancer. Women should have a Pap test starting at age 21. Between ages 21 and 29, Pap tests should be repeated every 2 years. Beginning at age 30, you should have a Pap test every 3 years as long as the past 3 Pap tests have been normal. If you had a hysterectomy for a problem that was not cancer or a condition that could lead to cancer, then you no longer need Pap tests. If you are between ages 65 and 70, and you have had normal Pap tests going back 10 years, you no longer need Pap tests. If you have had past treatment for cervical cancer or a condition that could lead to cancer, you need Pap tests and screening for cancer for at least 20 years after your treatment. If Pap  tests have been discontinued, risk factors (such as a new sexual partner) need to be reassessed to determine if screening should be resumed. Some women have medical problems that increase the chance of getting cervical cancer. In these cases, your caregiver may recommend more frequent screening and Pap tests.  The human papillomavirus (HPV) test is an additional test that may be used for cervical cancer screening. The HPV test looks for the virus that can cause the cell changes on the cervix. The cells collected during the Pap test can be tested for HPV. The HPV test could be used to screen women aged 30 years and older, and should be used in women of any age who have unclear Pap test results. After the age of 30, women should have HPV testing at the same frequency as a Pap test.  Colorectal cancer can be detected and often prevented. Most routine colorectal cancer screening begins at the age of 50 and continues through age 75. However, your caregiver   may recommend screening at an earlier age if you have risk factors for colon cancer. On a yearly basis, your caregiver may provide home test kits to check for hidden blood in the stool. Use of a small camera at the end of a tube, to directly examine the colon (sigmoidoscopy or colonoscopy), can detect the earliest forms of colorectal cancer. Talk to your caregiver about this at age 23, when routine screening begins. Direct examination of the colon should be repeated every 5 to 10 years through age 50, unless early forms of pre-cancerous polyps or small growths are found.  Hepatitis C blood testing is recommended for all people born from [birth year] through 1963 and any individual with known risks for hepatitis C.  Practice safe sex. Use condoms and avoid high-risk sexual practices to reduce the spread of sexually transmitted infections (STIs). Sexually active women aged 85 and younger should be checked for Chlamydia, which is a common sexually transmitted infection.  Older women with new or multiple partners should also be tested for Chlamydia. Testing for other STIs is recommended if you are sexually active and at increased risk.  Osteoporosis is a disease in which the bones lose minerals and strength with aging. This can result in serious bone fractures. The risk of osteoporosis can be identified using a bone density scan. Women ages 48 and over and women at risk for fractures or osteoporosis should discuss screening with their caregivers. Ask your caregiver whether you should be taking a calcium supplement or vitamin D to reduce the rate of osteoporosis.  Menopause can be associated with physical symptoms and risks. Hormone replacement therapy is available to decrease symptoms and risks. You should talk to your caregiver about whether hormone replacement therapy is right for you.  Use sunscreen. Apply sunscreen liberally and repeatedly throughout the day. You should seek shade when your shadow is shorter than you. Protect yourself by wearing long sleeves, pants, a wide-brimmed hat, and sunglasses year round, whenever you are outdoors.  Notify your caregiver of new moles or changes in moles, especially if there is a change in shape or color. Also notify your caregiver if a mole is larger than the size of a pencil eraser.  Stay current with your immunizations. Document Released: 12/04/2010 Document Revised: 09/15/2012 Document Reviewed: 12/04/2010 [facility] Patient Information 2014 [place]. Preparing for Pregnancy Preparing for pregnancy (preconceptual care) by getting counseling and information from your caregiver before getting pregnant is a good idea. It will help you and your baby have a better chance to have a healthy, safe pregnancy and delivery of your baby. Make an appointment with your caregiver to talk about your health, medical, and family history and how to prepare yourself before getting pregnant. Your caregiver will do a complete physical exam  and a Pap test. They will want to know:  About you, your spouse or partner, and your family's medical and genetic history.  If you are eating a balanced diet and drinking enough fluids.  What vitamins and mineral supplements you are taking. This includes taking folic acid before getting pregnant to help prevent birth defects.  What medications you are taking including prescription, over-the-counter and herbal medications.  If there is any substance abuse like alcohol, smoking, and illegal drugs.  If there is any mental or physical domestic violence.  If there is any risk of sexually transmitted disease between you and your partner.  What immunizations and vaccinations you have had and what you may need before getting pregnant.  If you should get tested for HIV infection.  If there is any exposure to chemical or toxic substances at home or work.  If there are medical problems you have that need to be treated and kept under control before getting pregnant such as diabetes, high blood pressure or others.  If there were any past surgeries, pregnancies and problems with them.  What your current weight is and to set a goal as to how much weight you should gain while pregnant. Also, they will check if you should lose or gain weight before getting pregnant.  What is your exercise routine and what it is safe when you are pregnant.  If there are any physical disabilities that need to be addressed.  About spacing your pregnancies when there are other children.  If there is a financial problem that may affect you having a child. After talking about the above points with your caregiver, your caregiver will give you advice on how to help treat and work with you on solving any issues, if necessary, before getting pregnant. The goal is to have a healthy and safe pregnancy for you and your baby. You should keep an accurate record of your menstrual periods because it will help in determining your  due date. Immunizations that you should have before getting pregnant:   Regular measles, [place] measles (rubella) and mumps.  Tetanus and diphtheria.  Chickenpox, if not immune.  Herpes zoster (Varicella) if not immune.  Human papilloma virus vaccine (HPV) between the age of 69 and 84 years old.  Hepatitis A vaccine.  Hepatitis B vaccine.  Influenza vaccine.  Pneumococcal vaccine (pneumonia). You should avoid getting pregnant for one month after getting vaccinated with a live virus vaccine such as [place] measles (rubella) which is in the MMR (Measles, Mumps and Rubella) vaccine. Other immunizations may be necessary depending on where you live, such as malaria. Ask your caregiver if any other immunizations are needed for you. HOME CARE INSTRUCTIONS   Follow the advice of your caregiver.  Before getting pregnant:  Begin taking vitamins, supplements, and 0.4 milligrams folic acid daily.  Get your immunizations up-to-date.  Get help from a nutrition counselor if you do not understand what a balanced diet is, need help with a special medical diet or if you need help to lose or gain weight.  Begin exercising.  Stop smoking, taking illegal drugs, and drinking alcoholic beverages.  Get counseling if there is and type of domestic violence.  Get checked for sexually transmitted diseases including HIV.  Get any medical problems under control (diabetes, high blood pressure, convulsions, asthma or others).  Resolve any financial concerns or create a plan to do so.  Be sure you and your spouse or partner are ready to have a baby.  Keep an accurate record of your menstrual periods. Document Released: 05/03/2008 Document Revised: 03/11/2013 Document Reviewed: 05/03/2008 [facility] Patient Information 2014 [place]. Depression, Adult Depression refers to feeling sad, low, down in the dumps, blue, gloomy, or empty. In general, there are two kinds of depression: 1. Depression  that we all experience from time to time because of upsetting life experiences, including the loss of a job or the ending of a relationship (normal sadness or normal grief). This kind of depression is considered normal, is short lived, and resolves within a few days to 2 weeks. (Depression experienced after the loss of a loved one is called bereavement. Bereavement often lasts longer than 2 weeks but normally gets better with time.)  2. Clinical depression, which lasts longer than normal sadness or normal grief or interferes with your ability to function at home, at work, and in school. It also interferes with your personal relationships. It affects almost every aspect of your life. Clinical depression is an illness. Symptoms of depression also can be caused by conditions other than normal sadness and grief or clinical depression. Examples of these conditions are listed as follows:  Physical illness Some physical illnesses, including underactive thyroid gland (hypothyroidism), severe anemia, specific types of cancer, diabetes, uncontrolled seizures, heart and lung problems, strokes, and chronic pain are commonly associated with symptoms of depression.  Side effects of some prescription medicine In some people, certain types of prescription medicine can cause symptoms of depression.  Substance abuse Abuse of alcohol and illicit drugs can cause symptoms of depression. SYMPTOMS Symptoms of normal sadness and normal grief include the following:  Feeling sad or crying for short periods of time.  Not caring about anything (apathy).  Difficulty sleeping or sleeping too much.  No longer able to enjoy the things you used to enjoy.  Desire to be by oneself all the time (social isolation).  Lack of energy or motivation.  Difficulty concentrating or remembering.  Change in appetite or weight.  Restlessness or agitation. Symptoms of clinical depression include the same symptoms of normal sadness or  normal grief and also the following symptoms:  Feeling sad or crying all the time.  Feelings of guilt or worthlessness.  Feelings of hopelessness or helplessness.  Thoughts of suicide or the desire to harm yourself (suicidal ideation).  Loss of touch with reality (psychotic symptoms). Seeing or hearing things that are not real (hallucinations) or having false beliefs about your life or the people around you (delusions and paranoia). DIAGNOSIS  The diagnosis of clinical depression usually is based on the severity and duration of the symptoms. Your caregiver also will ask you questions about your medical history and substance use to find out if physical illness, use of prescription medicine, or substance abuse is causing your depression. Your caregiver also may order blood tests. TREATMENT  Typically, normal sadness and normal grief do not require treatment. However, sometimes antidepressant medicine is prescribed for bereavement to ease the depressive symptoms until they resolve. The treatment for clinical depression depends on the severity of your symptoms but typically includes antidepressant medicine, counseling with a mental health professional, or a combination of both. Your caregiver will help to determine what treatment is best for you. Depression caused by physical illness usually goes away with appropriate medical treatment of the illness. If prescription medicine is causing depression, talk with your caregiver about stopping the medicine, decreasing the dose, or substituting another medicine. Depression caused by abuse of alcohol or illicit drugs abuse goes away with abstinence from these substances. Some adults need professional help in order to stop drinking or using drugs. SEEK IMMEDIATE CARE IF:  You have thoughts about hurting yourself or others.  You lose touch with reality (have psychotic symptoms).  You are taking medicine for depression and have a serious side effect. FOR  MORE INFORMATION National Alliance on Mental Illness: www.nami.[employer] of Mental [employer]: https://carter.com/ Document Released: 05/18/2000 Document Revised: 11/20/2011 Document Reviewed: 08/20/2011 [facility] Patient Information 2014 [place].

## 2013-09-09 ENCOUNTER — Other Ambulatory Visit: Payer: Self-pay | Admitting: *Deleted

## 2013-09-09 DIAGNOSIS — N760 Acute vaginitis: Principal | ICD-10-CM

## 2013-09-09 DIAGNOSIS — B9689 Other specified bacterial agents as the cause of diseases classified elsewhere: Secondary | ICD-10-CM

## 2013-09-09 MED ORDER — METRONIDAZOLE 500 MG PO TABS: 500.0000 mg | ORAL_TABLET | Freq: Two times a day (BID) | ORAL | Status: DC

## 2013-10-04 ENCOUNTER — Emergency Department (HOSPITAL_COMMUNITY): Payer: Medicaid Other | Admitting: Registered Nurse

## 2013-10-04 ENCOUNTER — Encounter (HOSPITAL_COMMUNITY)
Admission: EM | Disposition: A | Discharge status: Home / Self Care | Payer: Self-pay | Source: Home / Self Care | Attending: Emergency Medicine

## 2013-10-04 ENCOUNTER — Emergency Department (HOSPITAL_BASED_OUTPATIENT_CLINIC_OR_DEPARTMENT_OTHER): Payer: Medicaid Other

## 2013-10-04 ENCOUNTER — Observation Stay (HOSPITAL_BASED_OUTPATIENT_CLINIC_OR_DEPARTMENT_OTHER)
Admission: EM | Admit: 2013-10-04 | Discharge: 2013-10-05 | Disposition: A | Discharge status: Home / Self Care | Payer: Medicaid Other | Attending: Surgery | Admitting: Surgery

## 2013-10-04 ENCOUNTER — Encounter (HOSPITAL_BASED_OUTPATIENT_CLINIC_OR_DEPARTMENT_OTHER): Payer: Self-pay | Admitting: Emergency Medicine

## 2013-10-04 ENCOUNTER — Encounter (HOSPITAL_COMMUNITY): Payer: Medicaid Other | Admitting: Registered Nurse

## 2013-10-04 DIAGNOSIS — Z791 Long term (current) use of non-steroidal anti-inflammatories (NSAID): Secondary | ICD-10-CM | POA: Insufficient documentation

## 2013-10-04 DIAGNOSIS — F329 Major depressive disorder, single episode, unspecified: Secondary | ICD-10-CM | POA: Insufficient documentation

## 2013-10-04 DIAGNOSIS — F17200 Nicotine dependence, unspecified, uncomplicated: Secondary | ICD-10-CM | POA: Diagnosis not present

## 2013-10-04 DIAGNOSIS — K37 Unspecified appendicitis: Secondary | ICD-10-CM | POA: Diagnosis present

## 2013-10-04 DIAGNOSIS — K3580 Unspecified acute appendicitis: Secondary | ICD-10-CM

## 2013-10-04 DIAGNOSIS — Z23 Encounter for immunization: Secondary | ICD-10-CM | POA: Insufficient documentation

## 2013-10-04 DIAGNOSIS — F3289 Other specified depressive episodes: Secondary | ICD-10-CM | POA: Insufficient documentation

## 2013-10-04 HISTORY — PX: LAPAROSCOPIC APPENDECTOMY: SHX408

## 2013-10-04 LAB — CBC WITH DIFFERENTIAL/PLATELET
BASOS ABS: 0 10*3/uL (ref 0.0–0.1)
BASOS PCT: 0 % (ref 0–1)
Eosinophils Absolute: 0.1 10*3/uL (ref 0.0–0.7)
Eosinophils Relative: 1 % (ref 0–5)
HCT: 37.1 % (ref 36.0–46.0)
HEMOGLOBIN: 12.4 g/dL (ref 12.0–15.0)
Lymphocytes Relative: 15 % (ref 12–46)
Lymphs Abs: 1.4 10*3/uL (ref 0.7–4.0)
MCH: 23.2 pg — ABNORMAL LOW (ref 26.0–34.0)
MCHC: 33.4 g/dL (ref 30.0–36.0)
MCV: 69.3 fL — ABNORMAL LOW (ref 78.0–100.0)
Monocytes Absolute: 1.1 10*3/uL — ABNORMAL HIGH (ref 0.1–1.0)
Monocytes Relative: 12 % (ref 3–12)
NEUTROS ABS: 6.6 10*3/uL (ref 1.7–7.7)
NEUTROS PCT: 72 % (ref 43–77)
PLATELETS: 226 10*3/uL (ref 150–400)
RBC: 5.35 MIL/uL — ABNORMAL HIGH (ref 3.87–5.11)
RDW: 13.9 % (ref 11.5–15.5)
WBC: 9.2 10*3/uL (ref 4.0–10.5)

## 2013-10-04 LAB — BASIC METABOLIC PANEL
BUN: 8 mg/dL (ref 6–23)
CALCIUM: 9.8 mg/dL (ref 8.4–10.5)
CO2: 25 mEq/L (ref 19–32)
Chloride: 105 mEq/L (ref 96–112)
Creatinine, Ser: 0.6 mg/dL (ref 0.50–1.10)
Glucose, Bld: 87 mg/dL (ref 70–99)
POTASSIUM: 3.8 meq/L (ref 3.7–5.3)
SODIUM: 143 meq/L (ref 137–147)

## 2013-10-04 LAB — URINALYSIS, ROUTINE W REFLEX MICROSCOPIC
Bilirubin Urine: NEGATIVE
GLUCOSE, UA: NEGATIVE mg/dL
Hgb urine dipstick: NEGATIVE
Ketones, ur: 15 mg/dL — AB
LEUKOCYTES UA: NEGATIVE
NITRITE: NEGATIVE
PROTEIN: NEGATIVE mg/dL
Specific Gravity, Urine: 1.023 (ref 1.005–1.030)
UROBILINOGEN UA: 1 mg/dL (ref 0.0–1.0)
pH: 6 (ref 5.0–8.0)

## 2013-10-04 LAB — PREGNANCY, URINE: PREG TEST UR: NEGATIVE

## 2013-10-04 SURGERY — APPENDECTOMY, LAPAROSCOPIC
Anesthesia: General

## 2013-10-04 MED ORDER — MORPHINE SULFATE 2 MG/ML IJ SOLN
2.0000 mg | INTRAMUSCULAR | Status: DC | PRN
  Administered 2013-10-04: 2 mg via INTRAVENOUS
  Filled 2013-10-04: qty 1

## 2013-10-04 MED ORDER — GLYCOPYRROLATE 0.2 MG/ML IJ SOLN
INTRAMUSCULAR | Status: AC
  Filled 2013-10-04: qty 3

## 2013-10-04 MED ORDER — SODIUM CHLORIDE 0.9 % IV SOLN
3.0000 g | Freq: Four times a day (QID) | INTRAVENOUS | Status: AC
  Administered 2013-10-04 – 2013-10-05 (×3): 3 g via INTRAVENOUS
  Filled 2013-10-04 (×4): qty 3

## 2013-10-04 MED ORDER — DEXAMETHASONE SODIUM PHOSPHATE 10 MG/ML IJ SOLN
INTRAMUSCULAR | Status: DC | PRN
  Administered 2013-10-04: 10 mg via INTRAVENOUS

## 2013-10-04 MED ORDER — MORPHINE SULFATE 4 MG/ML IJ SOLN
4.0000 mg | Freq: Once | INTRAMUSCULAR | Status: AC
  Administered 2013-10-04: 4 mg via INTRAVENOUS
  Filled 2013-10-04: qty 1

## 2013-10-04 MED ORDER — DEXAMETHASONE SODIUM PHOSPHATE 10 MG/ML IJ SOLN
INTRAMUSCULAR | Status: AC
  Filled 2013-10-04: qty 1

## 2013-10-04 MED ORDER — SODIUM CHLORIDE 0.9 % IV SOLN
3.0000 g | Freq: Once | INTRAVENOUS | Status: AC
  Administered 2013-10-04: 3 g via INTRAVENOUS
  Filled 2013-10-04: qty 3

## 2013-10-04 MED ORDER — ONDANSETRON HCL 4 MG/2ML IJ SOLN
4.0000 mg | Freq: Once | INTRAMUSCULAR | Status: AC
  Administered 2013-10-04: 4 mg via INTRAVENOUS
  Filled 2013-10-04: qty 2

## 2013-10-04 MED ORDER — GLYCOPYRROLATE 0.2 MG/ML IJ SOLN
INTRAMUSCULAR | Status: DC | PRN
  Administered 2013-10-04: 0.4 mg via INTRAVENOUS

## 2013-10-04 MED ORDER — PNEUMOCOCCAL VAC POLYVALENT 25 MCG/0.5ML IJ INJ
0.5000 mL | INJECTION | INTRAMUSCULAR | Status: DC
  Filled 2013-10-04 (×2): qty 0.5

## 2013-10-04 MED ORDER — ONDANSETRON HCL 4 MG PO TABS: 4.0000 mg | ORAL_TABLET | Freq: Four times a day (QID) | ORAL | Status: DC | PRN

## 2013-10-04 MED ORDER — HEPARIN SODIUM (PORCINE) 5000 UNIT/ML IJ SOLN
5000.0000 [IU] | Freq: Three times a day (TID) | INTRAMUSCULAR | Status: DC
  Administered 2013-10-05: 5000 [IU] via SUBCUTANEOUS
  Filled 2013-10-04 (×5): qty 1

## 2013-10-04 MED ORDER — 0.9 % SODIUM CHLORIDE (POUR BTL) OPTIME
TOPICAL | Status: DC | PRN
  Administered 2013-10-04: 1000 mL

## 2013-10-04 MED ORDER — ONDANSETRON HCL 4 MG/2ML IJ SOLN
INTRAMUSCULAR | Status: DC | PRN
  Administered 2013-10-04: 4 mg via INTRAVENOUS

## 2013-10-04 MED ORDER — ROCURONIUM BROMIDE 100 MG/10ML IV SOLN
INTRAVENOUS | Status: AC
  Filled 2013-10-04: qty 1

## 2013-10-04 MED ORDER — ONDANSETRON HCL 4 MG/2ML IJ SOLN
INTRAMUSCULAR | Status: AC
  Filled 2013-10-04: qty 2

## 2013-10-04 MED ORDER — BUPIVACAINE HCL (PF) 0.5 % IJ SOLN
INTRAMUSCULAR | Status: AC
  Filled 2013-10-04: qty 30

## 2013-10-04 MED ORDER — ROCURONIUM BROMIDE 100 MG/10ML IV SOLN
INTRAVENOUS | Status: DC | PRN
Start: 2013-10-04 — End: 2013-10-04
  Administered 2013-10-04: 30 mg via INTRAVENOUS

## 2013-10-04 MED ORDER — LACTATED RINGERS IV SOLN: INTRAVENOUS | Status: DC

## 2013-10-04 MED ORDER — LACTATED RINGERS IV SOLN
INTRAVENOUS | Status: DC | PRN
  Administered 2013-10-04 (×2): 1000 mL

## 2013-10-04 MED ORDER — MIDAZOLAM HCL 2 MG/2ML IJ SOLN
INTRAMUSCULAR | Status: AC
  Filled 2013-10-04: qty 2

## 2013-10-04 MED ORDER — LACTATED RINGERS IV SOLN
INTRAVENOUS | Status: DC | PRN
Start: 2013-10-04 — End: 2013-10-04
  Administered 2013-10-04: 18:00:00 via INTRAVENOUS

## 2013-10-04 MED ORDER — BUPIVACAINE HCL (PF) 0.5 % IJ SOLN
INTRAMUSCULAR | Status: DC | PRN
  Administered 2013-10-04: 30 mL

## 2013-10-04 MED ORDER — PROPOFOL 10 MG/ML IV BOLUS
INTRAVENOUS | Status: AC
  Filled 2013-10-04: qty 20

## 2013-10-04 MED ORDER — IOHEXOL 300 MG/ML  SOLN
100.0000 mL | Freq: Once | INTRAMUSCULAR | Status: AC | PRN
  Administered 2013-10-04: 100 mL via INTRAVENOUS

## 2013-10-04 MED ORDER — LIDOCAINE HCL (CARDIAC) 20 MG/ML IV SOLN
INTRAVENOUS | Status: DC | PRN
  Administered 2013-10-04: 100 mg via INTRAVENOUS

## 2013-10-04 MED ORDER — SODIUM CHLORIDE 0.9 % IV SOLN
Freq: Once | INTRAVENOUS | Status: AC
  Administered 2013-10-04: 15:00:00 via INTRAVENOUS

## 2013-10-04 MED ORDER — NEOSTIGMINE METHYLSULFATE 10 MG/10ML IV SOLN
INTRAVENOUS | Status: DC | PRN
  Administered 2013-10-04: 3 mg via INTRAVENOUS

## 2013-10-04 MED ORDER — LIDOCAINE HCL (CARDIAC) 20 MG/ML IV SOLN
INTRAVENOUS | Status: AC
  Filled 2013-10-04: qty 5

## 2013-10-04 MED ORDER — IOHEXOL 300 MG/ML  SOLN
50.0000 mL | Freq: Once | INTRAMUSCULAR | Status: AC | PRN
  Administered 2013-10-04: 50 mL via ORAL

## 2013-10-04 MED ORDER — OXYCODONE-ACETAMINOPHEN 5-325 MG PO TABS
1.0000 | ORAL_TABLET | ORAL | Status: DC | PRN
  Administered 2013-10-04: 1 via ORAL
  Administered 2013-10-05 (×2): 2 via ORAL
  Administered 2013-10-05: 1 via ORAL
  Filled 2013-10-04 (×2): qty 2
  Filled 2013-10-04 (×2): qty 1

## 2013-10-04 MED ORDER — SUCCINYLCHOLINE CHLORIDE 20 MG/ML IJ SOLN
INTRAMUSCULAR | Status: DC | PRN
  Administered 2013-10-04: 100 mg via INTRAVENOUS

## 2013-10-04 MED ORDER — FENTANYL CITRATE 0.05 MG/ML IJ SOLN
INTRAMUSCULAR | Status: DC | PRN
  Administered 2013-10-04 (×5): 50 ug via INTRAVENOUS

## 2013-10-04 MED ORDER — MIDAZOLAM HCL 5 MG/5ML IJ SOLN
INTRAMUSCULAR | Status: DC | PRN
  Administered 2013-10-04: 2 mg via INTRAVENOUS

## 2013-10-04 MED ORDER — PROPOFOL 10 MG/ML IV BOLUS
INTRAVENOUS | Status: DC | PRN
  Administered 2013-10-04: 200 mg via INTRAVENOUS

## 2013-10-04 MED ORDER — ONDANSETRON HCL 4 MG/2ML IJ SOLN
4.0000 mg | INTRAMUSCULAR | Status: DC | PRN
  Filled 2013-10-04: qty 2

## 2013-10-04 MED ORDER — FENTANYL CITRATE 0.05 MG/ML IJ SOLN
INTRAMUSCULAR | Status: AC
  Filled 2013-10-04: qty 2

## 2013-10-04 MED ORDER — FENTANYL CITRATE 0.05 MG/ML IJ SOLN
INTRAMUSCULAR | Status: AC
  Filled 2013-10-04: qty 5

## 2013-10-04 MED ORDER — FENTANYL CITRATE 0.05 MG/ML IJ SOLN
25.0000 ug | INTRAMUSCULAR | Status: DC | PRN
  Administered 2013-10-04: 50 ug via INTRAVENOUS

## 2013-10-04 MED ORDER — KCL-LACTATED RINGERS-D5W 20 MEQ/L IV SOLN
INTRAVENOUS | Status: DC
  Administered 2013-10-04: 22:00:00 via INTRAVENOUS
  Filled 2013-10-04 (×5): qty 1000

## 2013-10-04 SURGICAL SUPPLY — 39 items
APPLIER CLIP 5 13 M/L LIGAMAX5 (MISCELLANEOUS)
APPLIER CLIP ROT 10 11.4 M/L (STAPLE)
BENZOIN TINCTURE PRP APPL 2/3 (GAUZE/BANDAGES/DRESSINGS) ×3 IMPLANT
CANISTER SUCTION 2500CC (MISCELLANEOUS) ×3 IMPLANT
CHLORAPREP W/TINT 26ML (MISCELLANEOUS) ×3 IMPLANT
CLIP APPLIE 5 13 M/L LIGAMAX5 (MISCELLANEOUS) IMPLANT
CLIP APPLIE ROT 10 11.4 M/L (STAPLE) IMPLANT
CLOSURE WOUND 1/2 X4 (GAUZE/BANDAGES/DRESSINGS) ×1
CUTTER FLEX LINEAR 45M (STAPLE) ×3 IMPLANT
DECANTER SPIKE VIAL GLASS SM (MISCELLANEOUS) ×3 IMPLANT
DRAIN CHANNEL 19F RND (DRAIN) IMPLANT
DRAPE LAPAROSCOPIC ABDOMINAL (DRAPES) ×3 IMPLANT
DRSG TEGADERM 2-3/8X2-3/4 SM (GAUZE/BANDAGES/DRESSINGS) ×6 IMPLANT
ELECT REM PT RETURN 9FT ADLT (ELECTROSURGICAL) ×3
ELECTRODE REM PT RTRN 9FT ADLT (ELECTROSURGICAL) ×1 IMPLANT
ENDOLOOP SUT PDS II  0 18 (SUTURE)
ENDOLOOP SUT PDS II 0 18 (SUTURE) IMPLANT
EVACUATOR SILICONE 100CC (DRAIN) IMPLANT
GLOVE ECLIPSE 8.0 STRL XLNG CF (GLOVE) ×3 IMPLANT
GLOVE INDICATOR 8.0 STRL GRN (GLOVE) ×3 IMPLANT
GOWN STRL REUS W/TWL XL LVL3 (GOWN DISPOSABLE) ×6 IMPLANT
KIT BASIN OR (CUSTOM PROCEDURE TRAY) ×3 IMPLANT
POUCH SPECIMEN RETRIEVAL 10MM (ENDOMECHANICALS) ×3 IMPLANT
RELOAD 45 VASCULAR/THIN (ENDOMECHANICALS) IMPLANT
RELOAD STAPLE TA45 3.5 REG BLU (ENDOMECHANICALS) ×3 IMPLANT
SCALPEL HARMONIC ACE (MISCELLANEOUS) ×3 IMPLANT
SET IRRIG TUBING LAPAROSCOPIC (IRRIGATION / IRRIGATOR) ×3 IMPLANT
SLEEVE XCEL OPT CAN 5 100 (ENDOMECHANICALS) ×3 IMPLANT
SOLUTION ANTI FOG 6CC (MISCELLANEOUS) ×3 IMPLANT
STRIP CLOSURE SKIN 1/2X4 (GAUZE/BANDAGES/DRESSINGS) ×2 IMPLANT
SUT ETHILON 3 0 PS 1 (SUTURE) IMPLANT
SUT MNCRL AB 4-0 PS2 18 (SUTURE) ×3 IMPLANT
TOWEL OR 17X26 10 PK STRL BLUE (TOWEL DISPOSABLE) ×3 IMPLANT
TOWEL OR NON WOVEN STRL DISP B (DISPOSABLE) ×3 IMPLANT
TRAY FOLEY CATH 14FRSI W/METER (CATHETERS) ×3 IMPLANT
TRAY LAP CHOLE (CUSTOM PROCEDURE TRAY) ×3 IMPLANT
TROCAR BLADELESS OPT 5 100 (ENDOMECHANICALS) ×3 IMPLANT
TROCAR XCEL BLUNT TIP 100MML (ENDOMECHANICALS) ×3 IMPLANT
TUBING INSUFFLATION 10FT LAP (TUBING) ×3 IMPLANT

## 2013-10-04 NOTE — ED Notes (Signed)
Bed: RU04WA25 Expected date:  Expected time:  Means of arrival:  Comments: [facility]

## 2013-10-04 NOTE — Anesthesia Preprocedure Evaluation (Addendum)
Anesthesia Evaluation  Patient identified by MRN, date of birth, ID band Patient awake    Reviewed: Allergy & Precautions, H&P , Patient's Chart, lab work & pertinent test results, reviewed documented beta blocker date and time   History of Anesthesia Complications (+) AWARENESS UNDER ANESTHESIA  Airway Mallampati: II TM Distance: >3 FB Neck ROM: full    Dental no notable dental hx.    Pulmonary Current Smoker,  breath sounds clear to auscultation  Pulmonary exam normal       Cardiovascular Rhythm:regular Rate:Normal     Neuro/Psych    GI/Hepatic   Endo/Other    Renal/GU      Musculoskeletal   Abdominal   Peds  Hematology   Anesthesia Other Findings   Reproductive/Obstetrics                        Anesthesia Physical Anesthesia Plan  ASA: II and emergent  Anesthesia Plan: General   Post-op Pain Management:    Induction: Intravenous, Cricoid pressure planned and Rapid sequence  Airway Management Planned: Oral ETT  Additional Equipment:   Intra-op Plan:   Post-operative Plan: Extubation in OR  Informed Consent: I have reviewed the patients History and Physical, chart, labs and discussed the procedure including the risks, benefits and alternatives for the proposed anesthesia with the patient or authorized representative who has indicated his/her understanding and acceptance.   Dental Advisory Given and Dental advisory given  Plan Discussed with: CRNA and Surgeon  Anesthesia Plan Comments: (  Discussed general anesthesia, including possible nausea, instrumentation of airway, sore throat,pulmonary aspiration, etc. I asked if the were any outstanding questions, or  concerns before we proceeded. )       Anesthesia Quick Evaluation

## 2013-10-04 NOTE — ED Notes (Signed)
Antibiotics halted for transport and [facility]

## 2013-10-04 NOTE — Anesthesia Postprocedure Evaluation (Signed)
  Anesthesia Post-op Note  Patient: Christina Greene  Procedure(s) Performed: Procedure(s): APPENDECTOMY LAPAROSCOPIC (N/A)  Patient Location: PACU  Anesthesia Type:General  Level of Consciousness: awake, alert , oriented and patient cooperative  Airway and Oxygen Therapy: Patient Spontanous Breathing  Post-op Pain: mild  Post-op Assessment: Post-op Vital signs reviewed, Patient's Cardiovascular Status Stable, Respiratory Function Stable, Patent Airway, No signs of Nausea or vomiting and Adequate PO intake  Post-op Vital Signs: Reviewed and stable  Last Vitals:  Filed Vitals:   10/04/13 1925  BP: 124/69  Pulse: 74  Temp: 36.8 C  Resp: 14    Complications: No apparent anesthesia complications

## 2013-10-04 NOTE — Transfer of Care (Signed)
Immediate Anesthesia Transfer of Care Note  Patient: Christina Greene  Procedure(s) Performed: Procedure(s) (LRB): APPENDECTOMY LAPAROSCOPIC (N/A)  Patient Location: PACU  Anesthesia Type: General  Level of Consciousness: sedated, patient cooperative and responds to stimulation  Airway & Oxygen Therapy: Patient Spontanous Breathing and Patient connected to face mask oxgen  Post-op Assessment: Report given to PACU RN and Post -op Vital signs reviewed and stable  Post vital signs: Reviewed and stable  Complications: No apparent anesthesia complications

## 2013-10-04 NOTE — ED Notes (Signed)
Patient here with 3 days of RLQ pain. Describes as constant, denies urinary symptoms, denies discharge. No constipation

## 2013-10-04 NOTE — Discharge Instructions (Addendum)
[facility] ______[facility] LAPAROSCOPIC SURGERY: POST OP INSTRUCTIONS Always review your discharge instruction sheet given to you by the facility where your surgery was performed. IF YOU HAVE DISABILITY OR FAMILY LEAVE FORMS, YOU MUST BRING THEM TO THE OFFICE FOR PROCESSING.   DO NOT GIVE THEM TO YOUR DOCTOR.  1. A prescription for pain medication may be given to you upon discharge.  Take your pain medication as prescribed, if needed.  If narcotic pain medicine is not needed, then you may take acetaminophen (Tylenol) or ibuprofen (Advil) as needed. 2. Take your usually prescribed medications unless otherwise directed. 3. If you need a refill on your pain medication, please contact your pharmacy.  They will contact our office to request authorization. Prescriptions will not be filled after 5pm or on week-ends. 4. You should follow a light diet the first few days after arrival home, such as soup and crackers, etc.  Be sure to include lots of fluids daily. 5. Most patients will experience some swelling and bruising in the area of the incisions.  Ice packs will help.  Swelling and bruising can take several days to resolve.  6. It is common to experience some constipation if taking pain medication after surgery.  Increasing fluid intake and taking a stool softener (such as Colace) will usually help or prevent this problem from occurring.  A mild laxative (Milk of Magnesia or Miralax) should be taken according to package instructions if there are no bowel movements after 48 hours. 7. Unless discharge instructions indicate otherwise, you may remove your bandages 72 hours after surgery, and you may shower at that time.  You may have steri-strips (small skin tapes) in place directly over the incision.  These strips should be left on the skin for 14.  If your surgeon used skin glue on the incision, you may shower in 24 hours.  The glue will flake off over the next 2-3 weeks.  Any sutures or staples  will be removed at the office during your follow-up visit. 8. ACTIVITIES:  You may resume regular (light) daily activities beginning the next day--such as daily self-care, walking, climbing stairs--gradually increasing activities as tolerated.  You may have sexual intercourse when it is comfortable.  Refrain from any heavy lifting or straining-nothing over 10 pounds for 2 weeks. a. You may drive when you are no longer taking prescription pain medication, you can comfortably wear a seatbelt, and you can safely maneuver your car and apply brakes. b. RETURN TO WORK:  _Desk work in one week, full duty in 2 weeks._________________________________________________________ 9. You should see your doctor in the office for a follow-up appointment approximately 2-3 weeks after your surgery.  Make sure that you call for this appointment within a day or two after you arrive home to insure a convenient appointment time. 10. OTHER INSTRUCTIONS: __________________________________________________________________________________________________________________________ __________________________________________________________________________________________________________________________ WHEN TO CALL YOUR DOCTOR: 1. Fever over 101.0 2. Inability to urinate 3. Continued bleeding from incision. 4. Increased pain, redness, or drainage from the incision. 5. Increasing abdominal pain  The clinic staff is available to answer your questions during regular business hours.  Please dont hesitate to call and ask to speak to one of the nurses for clinical concerns.  If you have a medical emergency, go to the nearest emergency room or call 911.  A surgeon from [facility] is always on call at the hospital. [address], [address], [place], [place]  [id number] ? [address] [address], [place], [place]   [id number] [phone])  [phone] ? [phone] ? FAX [phone] Web site: [email or web address]  Laparoscopic  Appendectomy Care After Refer to this sheet in the next few weeks. These instructions provide you with information on caring for yourself after your procedure. Your caregiver may also give you more specific instructions. Your treatment has been planned according to current medical practices, but problems sometimes occur. Call your caregiver if you have any problems or questions after your procedure. HOME CARE INSTRUCTIONS  Do not drive while taking narcotic pain medicines.  Use stool softener if you become constipated from your pain medicines.  Change your bandages (dressings) as directed.  Keep your wounds clean and dry. You may wash the wounds gently with soap and water. Gently pat the wounds dry with a clean towel.  Do not take baths, swim, or use hot tubs for 10 days, or as instructed by your caregiver.  Only take over-the-counter or prescription medicines for pain, discomfort, or fever as directed by your caregiver.  You may continue your normal diet as directed.  Do not lift more than 10 pounds (4.5 kg) or play contact sports for 3 weeks, or as directed.  Slowly increase your activity after surgery.  Take deep breaths to avoid getting a lung infection (pneumonia). SEEK MEDICAL CARE IF:  You have redness, swelling, or increasing pain in your wounds.  You have pus coming from your wounds.  You have drainage from a wound that lasts longer than 1 day.  You notice a bad smell coming from the wounds or dressing.  Your wound edges break open after stitches (sutures) have been removed.  You notice increasing pain in the shoulders (shoulder strap areas) or near your shoulder blades.  You develop dizzy episodes or fainting while standing.  You develop shortness of breath.  You develop persistent nausea or vomiting.  You cannot control your bowel functions or lose your appetite.  You develop diarrhea. SEEK IMMEDIATE MEDICAL CARE IF:   You have a fever.  You develop a  rash.  You have difficulty breathing or sharp pains in your chest.  You develop any reaction or side effects to medicines given. MAKE SURE YOU:  Understand these instructions.  Will watch your condition.  Will get help right away if you are not doing well or get worse. Document Released: 05/21/2005 Document Revised: 08/13/2011 Document Reviewed: 11/28/2010 [facility] Patient Information 2014 [employer] [place].   Appendicitis Appendicitis is when the appendix is swollen (inflamed). The inflammation can lead to developing a hole (perforation) and a collection of pus (abscess). CAUSES  There is not always an obvious cause of appendicitis. Sometimes it is caused by an obstruction in the appendix. The obstruction can be caused by:  A small, hard, pea-sized ball of stool (fecalith).  Enlarged lymph glands in the appendix. SYMPTOMS   Pain around your belly button (navel) that moves toward your lower right belly (abdomen). The pain can become more severe and sharp as time passes.  Tenderness in the lower right abdomen. Pain gets worse if you cough or make a sudden movement.  Feeling sick to your stomach (nauseous).  Throwing up (vomiting).  Loss of appetite.  Fever.  Constipation.  Diarrhea.  Generally not feeling well. DIAGNOSIS   Physical exam.  Blood tests.  Urine test.  X-rays or a CT scan may confirm the diagnosis. TREATMENT  Once the diagnosis of appendicitis is made, the most common treatment is to remove the appendix as soon as possible. This procedure is called appendectomy. In an open  appendectomy, a cut (incision) is made in the lower right abdomen and the appendix is removed. In a laparoscopic appendectomy, usually 3 small incisions are made. Long, thin instruments and a camera tube are used to remove the appendix. Most patients go home in 24 to 48 hours after appendectomy. In some situations, the appendix may have already perforated and an abscess may have  formed. The abscess may have a "wall" around it as seen on a CT scan. In this case, a drain may be placed into the abscess to remove fluid, and you may be treated with antibiotic medicines that kill germs. The medicine is given through a tube in your vein (IV). Once the abscess has resolved, it may or may not be necessary to have an appendectomy. You may need to stay in the hospital longer than 48 hours. Document Released: 05/21/2005 Document Revised: 11/20/2011 Document Reviewed: 08/16/2009 [facility] Patient Information 2014 [place], [place].  GETTING TO GOOD BOWEL HEALTH. Irregular bowel habits such as constipation and diarrhea can lead to many problems over time.  Having one soft bowel movement a day is the most important way to prevent further problems.  The anorectal canal is designed to handle stretching and feces to safely manage our ability to get rid of solid waste (feces, poop, stool) out of our body.  BUT, hard constipated stools can act like ripping concrete bricks and diarrhea can be a burning fire to this very sensitive area of our body, causing inflamed hemorrhoids, anal fissures, increasing risk is perirectal abscesses, abdominal pain/bloating, an making irritable bowel worse.     The goal: ONE SOFT BOWEL MOVEMENT A DAY!  To have soft, regular bowel movements:    Drink at least 8 tall glasses of water a day.     Take plenty of fiber.  Fiber is the undigested part of plant food that passes into the colon, acting s natures broom to encourage bowel motility and movement.  Fiber can absorb and hold large amounts of water. This results in a larger, bulkier stool, which is soft and easier to pass. Work gradually over several weeks up to 6 servings a day of fiber (25g a day even more if needed) in the form of: o Vegetables -- Root (potatoes, carrots, turnips), leafy green (lettuce, salad greens, celery, spinach), or cooked high residue (cabbage, broccoli, etc) o Fruit -- Fresh (unpeeled skin &  pulp), Dried (prunes, apricots, cherries, etc ),  or stewed ( applesauce)  o Whole grain breads, pasta, etc (whole wheat)  o Bran cereals    Bulking Agents -- This type of water-retaining fiber generally is easily obtained each day by one of the following:  o Psyllium bran -- The psyllium plant is remarkable because its ground seeds can retain so much water. This product is available as Metamucil, Konsyl, Effersyllium, Per Diem Fiber, or the less expensive generic preparation in drug and health food stores. Although labeled a laxative, it really is not a laxative.  o Methylcellulose -- This is another fiber derived from wood which also retains water. It is available as Citrucel. o Polyethylene Glycol - and artificial fiber commonly called Miralax or Glycolax.  It is helpful for people with gassy or bloated feelings with regular fiber o Flax Seed - a less gassy fiber than psyllium   No reading or other relaxing activity while on the toilet. If bowel movements take longer than 5 minutes, you are too constipated   AVOID CONSTIPATION.  High fiber and water intake usually  takes care of this.  Sometimes a laxative is needed to stimulate more frequent bowel movements, but    Laxatives are not a good long-term solution as it can wear the colon out. o Osmotics (Milk of Magnesia, Fleets phosphosoda, Magnesium citrate, MiraLax, GoLytely) are safer than  o Stimulants (Senokot, Castor Oil, Dulcolax, Ex Lax)    o Do not take laxatives for more than 7days in a row.    IF SEVERELY CONSTIPATED, try a Bowel Retraining Program: o Do not use laxatives.  o Eat a diet high in roughage, such as bran cereals and leafy vegetables.  o Drink six (6) ounces of prune or apricot juice each morning.  o Eat two (2) large servings of stewed fruit each day.  o Take one (1) heaping tablespoon of a psyllium-based bulking agent twice a day. Use sugar-free sweetener when possible to avoid excessive calories.  o Eat a normal  breakfast.  o Set aside 15 minutes after breakfast to sit on the toilet, but do not strain to have a bowel movement.  o If you do not have a bowel movement by the third day, use an enema and repeat the above steps.    Controlling diarrhea o Switch to liquids and simpler foods for a few days to avoid stressing your intestines further. o Avoid dairy products (especially milk & ice cream) for a short time.  The intestines often can lose the ability to digest lactose when stressed. o Avoid foods that cause gassiness or bloating.  Typical foods include beans and other legumes, cabbage, broccoli, and dairy foods.  Every person has some sensitivity to other foods, so listen to our body and avoid those foods that trigger problems for you. o Adding fiber (Citrucel, Metamucil, psyllium, Miralax) gradually can help thicken stools by absorbing excess fluid and retrain the intestines to act more normally.  Slowly increase the dose over a few weeks.  Too much fiber too soon can backfire and cause cramping & bloating. o Probiotics (such as active yogurt, Align, etc) may help repopulate the intestines and colon with normal bacteria and calm down a sensitive digestive tract.  Most studies show it to be of mild help, though, and such products can be costly. o Medicines:   Bismuth subsalicylate (ex. Kayopectate, Pepto Bismol) every 30 minutes for up to 6 doses can help control diarrhea.  Avoid if pregnant.   Loperamide (Immodium) can slow down diarrhea.  Start with two tablets (4mg  total) first and then try one tablet every 6 hours.  Avoid if you are having fevers or severe pain.  If you are not better or start feeling worse, stop all medicines and call your doctor for advice o Call your doctor if you are getting worse or not better.  Sometimes further testing (cultures, endoscopy, X-ray studies, bloodwork, etc) may be needed to help diagnose and treat the cause of the diarrhea. o

## 2013-10-04 NOTE — ED Provider Notes (Signed)
  Physical Exam  BP 118/76  Pulse 80  Temp(Src) 98.7 F (37.1 C) (Oral)  Resp 18  Wt 200 lb (90.719 kg)  SpO2 100%  LMP 09/19/2013  Physical Exam  ED Course  Procedures  Patient transferred from [place] for appy on CT. Dr. Purnell Shoemakerosenbauer notified and will admit.      Richardean Canalavid H Yao, MD 10/04/13 [phone]

## 2013-10-04 NOTE — ED Notes (Signed)
Dr. Abbey Chattersosenbower has just seen her and she has signed her consent and is quite enthusiastic to have her operation.  She states she has had rlq pain for 3 days.  She denies vomiting/diarrhea.

## 2013-10-04 NOTE — Op Note (Signed)
Appendectomy, Lap, Procedure Note  Pre-operative Diagnosis: Acute appendicitis  Post-operative Diagnosis: Same  Procedure:  Laparoscopic appendectomy  Surgeon:  Avel Peaceodd Yaneli Keithley, M.D.  Anesthesia:  General  Indications:  This is a 33 year old female with a three-day history of lower abdominal pain. The pain radiated to the right lower quadrant. CT scan demonstrated findings consistent with acute appendicitis and she is brought to the operating room.  Procedure Details   She was brought to the operating room, placed in the supine position and general anesthesia was induced, along with placement of orogastric tube, SCDs, and a Foley catheter. A timeout was performed. The abdomen was prepped and draped in a sterile fashion.  Marcaine was infiltrated into the subcutaneous tissues in the subumbilical region. A small infraumbilical incision was made through the skin, subcutaneous tissue, fascia, and peritoneum entering the peritoneal cavity under direct vision.  A pursestring suture was passed around the fascia with a 0 Vicryl.  The Hasson was introduced into the peritoneal cavity and the tails of the suture were used to hold the Hasson in place.   The pneumoperitoneum was then established to steady pressure of 15 mmHg.   The laparoscope was introduced and there was no evidence of bleeding or underlying organ injury. Additional 5 mm cannulas then placed in the left lower quadrant of the abdomen and the right upper quadrant region under direct visualization. A careful evaluation of the entire abdomen was carried out. The patient was placed in Trendelenburg and left lateral decubitus position. The small intestines were retracted in the cephalad and left lateral direction away from the pelvis and right lower quadrant. The patient was found to have an enlarged and inflamed appendix. The inflammatory process was adherent to the [facility] the distal ileum. Using careful blunt dissection and the harmonic  scalpel it was separated from the mesentery. There was no evidence of perforation.  The appendix was carefully mobilized. The mesoappendix was was divided with the harmonic scalpel.   The appendix was amputated off the cecum, with a small cuff of cecum, using an endo-GIA stapler.  The appendix was placed in a retrieval bag and removed through the subumbilical port incision.    There was no evidence of bleeding, leakage, or complication after division of the appendix. Copious irrigation was  performed and irrigant fluid suctioned from the abdomen as much as possible.  The umbilical trocar was removed and the  port site fascia was closed via tying purse string suture under laparoscopic vision. There was no residual palpable fascial defect.  The remaining trocars were removed and all  trocar site skin wounds were closed with 4-0 Monocryl.  Instrument, sponge, and needle counts were correct at the conclusion of the case.   Findings: The appendix was found to be inflamed. There were not signs of necrosis.  There was not perforation. There was not abscess formation.  Estimated Blood Loss:  150 ml         Drains: none         Specimens: Appendix         Complications:  None; patient tolerated the procedure well.         Disposition: PACU - hemodynamically stable.         Condition: stable

## 2013-10-04 NOTE — H&P (Signed)
Christina Greene is an 33 y.o. female.   Chief Complaint: Progressive right lower quadrant pain HPI: She had the onset of diffuse lower abdominal pain about 3 days ago. The pain radiated into the right lower quadrant and persisted and progressed. She presented to the [facility] and was evaluated there. A CT scan was consistent with acute appendicitis. She was transferred to [facility] for further evaluation and treatment. She denies fever or chills. Denies nausea or vomiting. Denies diarrhea.  Past Medical History  Diagnosis Date  . Ectopic pregnancy   . Ovarian cyst   . Abnormal Pap smear     colpo ok  . Depression     doing ok    Past Surgical History  Procedure Laterality Date  . Dilation and curettage of uterus    . Laparotomy      ruptured ectopic, rt tube; ovarian cyst  . Wisdom tooth extraction    . Nasal fracture surgery  2002  . Hand surgery Right     Family History  Problem Relation Age of Onset  . Hypertension Mother   . Hypothyroidism Mother   . Diabetes Father   . Liver disease Father     hepatitis  . Hepatitis Father   . Other Neg Hx    Social History:  reports that she has been smoking Cigars.  She does not have any smokeless tobacco history on file. She reports that she drinks alcohol. She reports that she does not use illicit drugs.  Allergies: No Known Allergies  Prior to Admission medications   Medication Sig Start Date End Date Taking? Authorizing Provider  aspirin-acetaminophen-caffeine (EXCEDRIN MIGRAINE) [phone] MG per tablet Take 2 tablets by mouth every 6 (six) hours as needed for headache.   Yes Historical Provider, MD  Aspirin-Acetaminophen-Caffeine (GOODY HEADACHE PO) Take 1 packet by mouth daily as needed (back pain).   Yes Historical Provider, MD  naproxen sodium (ANAPROX) 220 MG tablet Take 440 mg by mouth daily as needed (back pain).   Yes Historical Provider, MD      (Not in a hospital admission)  Results  for orders placed during the hospital encounter of 10/04/13 (from the past 48 hour(s))  URINALYSIS, ROUTINE W REFLEX MICROSCOPIC     Status: Abnormal   Collection Time    10/04/13 10:53 AM      Result Value Ref Range   Color, Urine YELLOW  YELLOW   APPearance CLEAR  CLEAR   Specific Gravity, Urine 1.023  1.005 - 1.030   pH 6.0  5.0 - 8.0   Glucose, UA NEGATIVE  NEGATIVE mg/dL   Hgb urine dipstick NEGATIVE  NEGATIVE   Bilirubin Urine NEGATIVE  NEGATIVE   Ketones, ur 15 (*) NEGATIVE mg/dL   Protein, ur NEGATIVE  NEGATIVE mg/dL   Urobilinogen, UA 1.0  0.0 - 1.0 mg/dL   Nitrite NEGATIVE  NEGATIVE   Leukocytes, UA NEGATIVE  NEGATIVE   Comment: MICROSCOPIC NOT DONE ON URINES WITH NEGATIVE PROTEIN, BLOOD, LEUKOCYTES, NITRITE, OR GLUCOSE <1000 mg/dL.  PREGNANCY, URINE     Status: None   Collection Time    10/04/13 10:53 AM      Result Value Ref Range   Preg Test, Ur NEGATIVE  NEGATIVE   Comment:            THE SENSITIVITY OF THIS     METHODOLOGY IS >20 mIU/mL.  CBC WITH DIFFERENTIAL     Status: Abnormal   Collection Time  10/04/13 11:15 AM      Result Value Ref Range   WBC 9.2  4.0 - 10.5 K/uL   RBC 5.35 (*) 3.87 - 5.11 MIL/uL   Hemoglobin 12.4  12.0 - 15.0 g/dL   HCT 37.1  36.0 - 46.0 %   MCV 69.3 (*) 78.0 - 100.0 fL   MCH 23.2 (*) 26.0 - 34.0 pg   MCHC 33.4  30.0 - 36.0 g/dL   RDW 13.9  11.5 - 15.5 %   Platelets 226  150 - 400 K/uL   Neutrophils Relative % 72  43 - 77 %   Neutro Abs 6.6  1.7 - 7.7 K/uL   Lymphocytes Relative 15  12 - 46 %   Lymphs Abs 1.4  0.7 - 4.0 K/uL   Monocytes Relative 12  3 - 12 %   Monocytes Absolute 1.1 (*) 0.1 - 1.0 K/uL   Eosinophils Relative 1  0 - 5 %   Eosinophils Absolute 0.1  0.0 - 0.7 K/uL   Basophils Relative 0  0 - 1 %   Basophils Absolute 0.0  0.0 - 0.1 K/uL   RBC Morphology TARGET CELLS     Comment: ELLIPTOCYTES  BASIC METABOLIC PANEL     Status: None   Collection Time    10/04/13 11:15 AM      Result Value Ref Range   Sodium  143  137 - 147 mEq/L   Potassium 3.8  3.7 - 5.3 mEq/L   Chloride 105  96 - 112 mEq/L   CO2 25  19 - 32 mEq/L   Glucose, Bld 87  70 - 99 mg/dL   BUN 8  6 - 23 mg/dL   Creatinine, Ser 0.60  0.50 - 1.10 mg/dL   Calcium 9.8  8.4 - 10.5 mg/dL   GFR calc non Af Amer >90  >90 mL/min   GFR calc Af Amer >90  >90 mL/min   Comment: (NOTE)     The eGFR has been calculated using the CKD EPI equation.     This calculation has not been validated in all clinical situations.     eGFR's persistently <90 mL/min signify possible Chronic Kidney     Disease.   Ct Abdomen Pelvis W Contrast  10/04/2013   CLINICAL DATA:  Right lower quadrant pain.  EXAM: CT ABDOMEN AND PELVIS WITH CONTRAST  TECHNIQUE: Multidetector CT imaging of the abdomen and pelvis was performed using the standard protocol following bolus administration of intravenous contrast.  CONTRAST:  29m OMNIPAQUE IOHEXOL 300 MG/ML SOLN, 1057mOMNIPAQUE IOHEXOL 300 MG/ML SOLN  COMPARISON:  None.  FINDINGS: The lung bases are clear. No pleural effusion or pulmonary nodule. The heart is normal in size. No pericardial effusion  The liver is unremarkable. No focal hepatic lesions or intrahepatic biliary dilatation. Gallbladder is normal. No common bile duct dilatation. The pancreas is normal. The spleen is normal. The adrenal glands kidneys are normal. No renal or obstructing ureteral calculi. No findings for pyelonephritis.  The stomach, duodenum, small bowel and colon are unremarkable. No inflammatory changes, mass lesions or obstructive findings.  The appendix is distended and inflamed with wall thickening and enhancement. This also para appendiceal inflammatory changes. A small appendicolith is noted. No findings for rupture.  No mesenteric or retroperitoneal mass or adenopathy. Pericecal inflamed/hyperplastic lymph nodes are noted.  The uterus and ovaries are unremarkable. There is a simple appearing cyst associated with the right ovary. The bladder is normal. No  pelvic mass or  abscess. No free pelvic fluid collections. No inguinal mass or adenopathy.  The bony structures are unremarkable.  IMPRESSION: CT findings consistent with acute appendicitis. No findings for rupture or abscess. There is an obstructing appendicolith proximally.  These results were called by telephone at the time of interpretation on 10/04/2013 at 1:33 PM to Dr. Thayer Jew , who verbally acknowledged these results.   Electronically Signed   By: Kalman Jewels M.D.   On: 10/04/2013 13:33    Review of Systems  Constitutional: Negative for fever, chills and weight loss.  HENT: Negative.   Cardiovascular: Negative.   Gastrointestinal: Positive for abdominal pain. Negative for diarrhea and blood in stool.  Genitourinary: Negative for dysuria and hematuria.  Endo/Heme/Allergies: Negative.     Blood pressure 118/76, pulse 80, temperature 98.7 F (37.1 C), temperature source Oral, resp. rate 18, weight 200 lb (90.719 kg), last menstrual period 09/19/2013, SpO2 100.00%, unknown if currently breastfeeding. Physical Exam  Constitutional: No distress.  Overweight.  HENT:  Head: Normocephalic and atraumatic.  Eyes: EOM are normal. No scleral icterus.  Neck: Neck supple.  Cardiovascular: Normal rate and regular rhythm.   Respiratory: Effort normal and breath sounds normal.  GI: Soft.  A small subumbilical scar. Lower transverse scar.  There is right lower quadrant tenderness and guarding. There is no mass.  Musculoskeletal: She exhibits no edema.  Lymphadenopathy:    She has no cervical adenopathy.  Neurological: She is alert.  Skin: Skin is warm and dry.  Psychiatric: She has a normal mood and affect. Her behavior is normal.     Assessment/Plan Acute appendicitis.  She has received IV antibiotics.  Plan: Laparoscopic possible open appendectomy.  I have discussed the procedure and risks of appendectomy. The risks include but are not limited to bleeding, infection, wound  problems, anesthesia, injury to intra-abdominal organs, possibility of postoperative ileus. She seems to understand and agrees with the plan.  Rhunette Croft Jashua Knaak 10/04/2013, 5:21 PM

## 2013-10-04 NOTE — ED Notes (Signed)
She has just arrived from [facility] alert and comfortable, with an IV in her right anticubital with NSS and Unasyn 3 gm. IVPB infusing.  She articulates understanding that she is to have her appendix out and is to see a Careers advisersurgeon here shortly.

## 2013-10-04 NOTE — ED Provider Notes (Signed)
CSN: [id number]     Arrival date & time 10/04/13  1039 History   First MD Initiated Contact with Patient 10/04/13 1041     Chief Complaint  Patient presents with  . Abdominal Pain     (Consider location/radiation/quality/duration/timing/severity/associated sxs/prior Treatment) HPI  This is a 33 year old female who presents with abdominal pain. History of ectopic pregnancy and ovarian cyst. She reports right tube removal. Patient reports 2 to three-day history of worsening right lower quadrant pain. Pain started around her umbilicus and at her right lower quadrant. She states that it is sharp. Currently a 7/10. She states it is worse when she's walking or "hits a bump in the road." She does report decreased by mouth intake. She denies any fevers. She denies any nausea, vomiting, or diarrhea. Last menstrual period was April 18.  Past Medical History  Diagnosis Date  . Ectopic pregnancy   . Ovarian cyst   . Abnormal Pap smear     colpo ok  . Depression     doing ok   Past Surgical History  Procedure Laterality Date  . Dilation and curettage of uterus    . Laparotomy      ruptured ectopic, rt tube; ovarian cyst  . Wisdom tooth extraction    . Nasal fracture surgery  2002  . Hand surgery Right    Family History  Problem Relation Age of Onset  . Hypertension Mother   . Hypothyroidism Mother   . Diabetes Father   . Liver disease Father     hepatitis  . Hepatitis Father   . Other Neg Hx    History  Substance Use Topics  . Smoking status: Current Some Day Smoker -- 4 years    Types: Cigars  . Smokeless tobacco: Not on file     Comment: black and milds  . Alcohol Use: Yes     Comment: occ   OB History   Grav Para Term Preterm Abortions TAB SAB Ect Mult Living   4 1 1  0 3 0 2 1 0 1     Review of Systems  Constitutional: Negative for fever.  Respiratory: Negative for chest tightness and shortness of breath.   Cardiovascular: Negative for chest pain.  Gastrointestinal:  Positive for abdominal pain. Negative for nausea, vomiting and diarrhea.       Anorexia  Genitourinary: Negative for dysuria.  Neurological: Negative for headaches.  Psychiatric/Behavioral: Negative for confusion.  All other systems reviewed and are negative.     Allergies  Review of patient's allergies indicates no known allergies.  Home Medications   Prior to Admission medications   Not on File   BP 135/76  Pulse 88  Temp(Src) 98.2 F (36.8 C) (Oral)  Resp 18  Wt 200 lb (90.719 kg)  SpO2 100%  LMP 09/19/2013 Physical Exam  Nursing note and vitals reviewed. Constitutional: She is oriented to person, place, and time. She appears well-developed and well-nourished.  overweight  HENT:  Head: Normocephalic and atraumatic.  Cardiovascular: Normal rate, regular rhythm and normal heart sounds.   No murmur heard. Pulmonary/Chest: Effort normal and breath sounds normal. No respiratory distress. She has no wheezes.  Abdominal: Soft. Bowel sounds are normal. There is tenderness. There is guarding. There is no rebound.  Tenderness palpation over the right lower quadrant with voluntary guarding, positive Rovsing  Musculoskeletal: She exhibits no edema.  Neurological: She is alert and oriented to person, place, and time.  Skin: Skin is warm and dry.  Psychiatric: She  has a normal mood and affect.    ED Course  Procedures (including critical care time) Labs Review Labs Reviewed  URINALYSIS, ROUTINE W REFLEX MICROSCOPIC - Abnormal; Notable for the following:    Ketones, ur 15 (*)    All other components within normal limits  CBC WITH DIFFERENTIAL - Abnormal; Notable for the following:    RBC 5.35 (*)    MCV 69.3 (*)    MCH 23.2 (*)    Monocytes Absolute 1.1 (*)    All other components within normal limits  PREGNANCY, URINE  BASIC METABOLIC PANEL    Imaging Review Ct Abdomen Pelvis W Contrast  10/04/2013   CLINICAL DATA:  Right lower quadrant pain.  EXAM: CT ABDOMEN AND  PELVIS WITH CONTRAST  TECHNIQUE: Multidetector CT imaging of the abdomen and pelvis was performed using the standard protocol following bolus administration of intravenous contrast.  CONTRAST:  50mL OMNIPAQUE IOHEXOL 300 MG/ML SOLN, 100mL OMNIPAQUE IOHEXOL 300 MG/ML SOLN  COMPARISON:  None.  FINDINGS: The lung bases are clear. No pleural effusion or pulmonary nodule. The heart is normal in size. No pericardial effusion  The liver is unremarkable. No focal hepatic lesions or intrahepatic biliary dilatation. Gallbladder is normal. No common bile duct dilatation. The pancreas is normal. The spleen is normal. The adrenal glands kidneys are normal. No renal or obstructing ureteral calculi. No findings for pyelonephritis.  The stomach, duodenum, small bowel and colon are unremarkable. No inflammatory changes, mass lesions or obstructive findings.  The appendix is distended and inflamed with wall thickening and enhancement. This also para appendiceal inflammatory changes. A small appendicolith is noted. No findings for rupture.  No mesenteric or retroperitoneal mass or adenopathy. Pericecal inflamed/hyperplastic lymph nodes are noted.  The uterus and ovaries are unremarkable. There is a simple appearing cyst associated with the right ovary. The bladder is normal. No pelvic mass or abscess. No free pelvic fluid collections. No inguinal mass or adenopathy.  The bony structures are unremarkable.  IMPRESSION: CT findings consistent with acute appendicitis. No findings for rupture or abscess. There is an obstructing appendicolith proximally.  These results were called by telephone at the time of interpretation on 10/04/2013 at 1:33 PM to Dr. Ross MarcusOURTNEY, HORTON , who verbally acknowledged these results.   Electronically Signed   By: Loralie ChampagneMark  Gallerani M.D.   On: 10/04/2013 13:33     EKG Interpretation None      MDM   Final diagnoses:  Acute appendicitis    Patient presents with right lower quadrant pain. History physical  exam concerning for acute appendicitis. Urine pregnancy is negative. No evidence of leukocytosis or fever. Patient has been n.p.o. since 3 AM. CT consistent with acute appendicitis and appendicolith. Will discuss with surgery in transfer for definitive management.    Shon Batonourtney F Horton, MD 10/04/13 1341

## 2013-10-05 ENCOUNTER — Encounter: Payer: Self-pay | Admitting: General Surgery

## 2013-10-05 MED ORDER — NAPROXEN 375 MG PO TABS
375.0000 mg | ORAL_TABLET | Freq: Two times a day (BID) | ORAL | Status: DC | PRN
  Filled 2013-10-05: qty 1

## 2013-10-05 MED ORDER — OXYCODONE-ACETAMINOPHEN 5-325 MG PO TABS: 1.0000 | ORAL_TABLET | ORAL | Status: DC | PRN

## 2013-10-05 NOTE — Progress Notes (Signed)
1 Day Post-Op  Subjective: She has had some clears, and walked to BR, but not much more, she is rather sore as expected.  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  Objective: Vital signs in last 24 hours: Temp:  [98 F (36.7 C)-98.7 F (37.1 C)] 98 F (36.7 C) (05/04 0612) Pulse Rate:  [67-107] 80 (05/04 0612) Resp:  [11-18] 18 (05/04 0612) BP: (114-144)/(69-86) 114/71 mmHg (05/04 0612) SpO2:  [94 %-100 %] 96 % (05/04 0612) Weight:  [90.719 kg (200 lb)] 90.719 kg (200 lb) (05/03 2133) Last BM Date: 10/02/13 Diet: regular Afebrile, VSS No labs Intake/Output from previous day: 05/03 0701 - 05/04 0700 In: 1585 [I.V.:1585] Out: 2500 [Urine:2500] Intake/Output this shift:    General appearance: alert, cooperative and no distress GI: soft sore, port sites look good.  +Bs  Lab Results:   Recent Labs  10/04/13 1115  WBC 9.2  HGB 12.4  HCT 37.1  PLT 226    BMET  Recent Labs  10/04/13 1115  NA 143  K 3.8  CL 105  CO2 25  GLUCOSE 87  BUN 8  CREATININE 0.60  CALCIUM 9.8   PT/INR No results found for this basename: LABPROT, INR,  in the last 72 hours  No results found for this basename: AST, ALT, ALKPHOS, BILITOT, PROT, ALBUMIN,  in the last 168 hours   Lipase  No results found for this basename: lipase     Studies/Results: Ct Abdomen Pelvis W Contrast  10/04/2013   CLINICAL DATA:  Right lower quadrant pain.  EXAM: CT ABDOMEN AND PELVIS WITH CONTRAST  TECHNIQUE: Multidetector CT imaging of the abdomen and pelvis was performed using the standard protocol following bolus administration of intravenous contrast.  CONTRAST:  50mL OMNIPAQUE IOHEXOL 300 MG/ML SOLN, 100mL OMNIPAQUE IOHEXOL 300 MG/ML SOLN  COMPARISON:  None.  FINDINGS: The lung bases are clear. No pleural effusion or pulmonary nodule. The heart is normal in size. No pericardial effusion  The liver is unremarkable. No focal hepatic lesions or intrahepatic biliary dilatation. Gallbladder is normal. No common bile duct  dilatation. The pancreas is normal. The spleen is normal. The adrenal glands kidneys are normal. No renal or obstructing ureteral calculi. No findings for pyelonephritis.  The stomach, duodenum, small bowel and colon are unremarkable. No inflammatory changes, mass lesions or obstructive findings.  The appendix is distended and inflamed with wall thickening and enhancement. This also para appendiceal inflammatory changes. A small appendicolith is noted. No findings for rupture.  No mesenteric or retroperitoneal mass or adenopathy. Pericecal inflamed/hyperplastic lymph nodes are noted.  The uterus and ovaries are unremarkable. There is a simple appearing cyst associated with the right ovary. The bladder is normal. No pelvic mass or abscess. No free pelvic fluid collections. No inguinal mass or adenopathy.  The bony structures are unremarkable.  IMPRESSION: CT findings consistent with acute appendicitis. No findings for rupture or abscess. There is an obstructing appendicolith proximally.  These results were called by telephone at the time of interpretation on 10/04/2013 at 1:33 PM to Dr. Ross MarcusOURTNEY, HORTON , who verbally acknowledged these results.   Electronically Signed   By: Loralie ChampagneMark  Gallerani M.D.   On: 10/04/2013 13:33    Medications: . ampicillin-sulbactam (UNASYN) IV  3 g Intravenous Q6H  . heparin subcutaneous  5,000 Units Subcutaneous 3 times per day  . pneumococcal 23 valent vaccine  0.5 mL Intramuscular Tomorrow-1000   . dextrose 5% lactated ringers with KCl 20 mEq/L 100 mL/hr at 10/04/13 2209  Prior to Admission medications   Medication Sig Start Date End Date Taking? Authorizing Provider  aspirin-acetaminophen-caffeine (EXCEDRIN MIGRAINE) [id number] MG per tablet Take 2 tablets by mouth every 6 (six) hours as needed for headache.   Yes Historical Provider, MD  Aspirin-Acetaminophen-Caffeine (GOODY HEADACHE PO) Take 1 packet by mouth daily as needed (back pain).   Yes Historical Provider, MD   naproxen sodium (ANAPROX) 220 MG tablet Take 440 mg by mouth daily as needed (back pain).   Yes Historical Provider, MD     Assessment/Plan Acute appendicitis Laparoscopic appendectomy, Adolph Pollackodd J Rosenbower, MD, 10/04/2013. Depression  Hx of Ectopic pregnancy   Plan:  She looks good, we will mobilize her and advance her diet.  If she does well home later today.     LOS: 1 day    Sherrie GeorgeWillard Taylinn Brabant 10/05/2013

## 2013-10-05 NOTE — Discharge Summary (Signed)
Physician Discharge Summary  Patient ID: Christina Greene MRN: [id number] DOB/AGE: 08-Oct-1980 33 y.o.  Admit date: 10/04/2013 Discharge date: 10/05/2013  Admission Diagnoses:   Acute appendicitis   Depression  Hx of Ectopic pregnancy   Discharge Diagnoses:  Active Problems:   Appendicitis, acute   Appendicitis   PROCEDURES: Laparoscopic appendectomy, Adolph Pollackodd J Rosenbower, MD, 10/04/2013.    Hospital Course: She had the onset of diffuse lower abdominal pain about 3 days ago. The pain radiated into the right lower quadrant and persisted and progressed. [facility] to [facility] and was evaluated there. A CT scan was consistent with acute appendicitis. She was [facility] to [facility] for further evaluation and treatment. She denies fever or chills. Denies nausea or vomiting. Denies diarrhea. She was taken to the OR from the ER by DR. Rosenbower.  She did well, her diet was advanced and she was ready for d/c home after lunch 10/05/12.  Condition on d/c:  Improved           Disposition: 01-Home or Self Care   Future Appointments Provider Department Dept Phone   10/27/2013 1:45 PM [facility] [place] [phone]       Medication List         aspirin-acetaminophen-caffeine [phone]) [phone] MG per tablet  Commonly known as:  EXCEDRIN MIGRAINE  Take 2 tablets by mouth every 6 (six) hours as needed for headache.     naproxen sodium 220 MG tablet  Commonly known as:  ANAPROX  Take 440 mg by mouth daily as needed (back pain).     oxyCODONE-acetaminophen 5-325 MG per tablet  Commonly known as:  PERCOCET/ROXICET  Take 1-2 tablets by mouth every 4 (four) hours as needed for moderate pain.           Follow-up Information   Follow up with [facility] On 10/27/2013. (Your appointment is at 1:45 Pm be there at 1:15PM for check in.)    Contact information:   [address]   [place]  [id number] [phone]       Signed: Sherrie GeorgeWillard Doniqua Saxby 10/05/2013, 1:39 PM

## 2013-10-05 NOTE — Discharge Summary (Signed)
Improved OR findings noted Follow closely

## 2013-10-05 NOTE — Progress Notes (Signed)
Sore but feeling better Tolerated PO Abd soft.  Dressings c/d/i  D/C patient from hospital when patient meets criteria (anticipate in 0-1 day(s)):  Tolerating oral intake well Ambulating in walkways Adequate pain control without IV medications Urinating  Having flatus  Ardeth SportsmanSteven C. Zaara Sprowl, M.D., F.A.C.S. Gastro[facility] [address] [place], [place] [id number] [phone] Main / Paging

## 2013-10-05 NOTE — Progress Notes (Signed)
UR Completed.  Amayra Kiedrowski Jane Arvin Abello [phone] 10/05/2013  

## 2013-10-06 ENCOUNTER — Telehealth (INDEPENDENT_AMBULATORY_CARE_PROVIDER_SITE_OTHER): Payer: Self-pay | Admitting: General Surgery

## 2013-10-06 ENCOUNTER — Encounter (HOSPITAL_COMMUNITY): Payer: Self-pay | Admitting: General Surgery

## 2013-10-06 ENCOUNTER — Other Ambulatory Visit (INDEPENDENT_AMBULATORY_CARE_PROVIDER_SITE_OTHER): Payer: Self-pay | Admitting: Surgery

## 2013-10-06 ENCOUNTER — Telehealth (INDEPENDENT_AMBULATORY_CARE_PROVIDER_SITE_OTHER): Payer: Self-pay

## 2013-10-06 MED ORDER — OXYCODONE-ACETAMINOPHEN 5-325 MG PO TABS: 1.0000 | ORAL_TABLET | ORAL | Status: DC | PRN

## 2013-10-06 NOTE — Telephone Encounter (Signed)
Patient calling into office requesting a refill on her pain medication (Percocet) patient states she's taking 2 at a time and it's not helping with her pain.  Patient denies having any fevers, nausea or vomiting.  Patient status post Laparoscopic Appendectomy on 10/04/13.

## 2013-10-06 NOTE — Telephone Encounter (Signed)
Have someone give her Norco 5/325 instead please.  Also, have her take 600mg  of Ibuprofen every 6 hours as needed.

## 2013-10-06 NOTE — Telephone Encounter (Signed)
Called patient to let her know that she has a Rx for Percocet 5/325 the Rx will be at the front desk

## 2013-10-27 ENCOUNTER — Encounter (INDEPENDENT_AMBULATORY_CARE_PROVIDER_SITE_OTHER): Payer: Self-pay

## 2013-10-27 ENCOUNTER — Ambulatory Visit (INDEPENDENT_AMBULATORY_CARE_PROVIDER_SITE_OTHER): Payer: Medicaid Other | Admitting: General Surgery

## 2013-10-27 VITALS — BP 122/72 | HR 74 | Temp 98.0°F | Resp 18 | Ht 66.0 in | Wt 201.0 lb

## 2013-10-27 DIAGNOSIS — K3580 Unspecified acute appendicitis: Secondary | ICD-10-CM

## 2013-10-27 NOTE — Patient Instructions (Signed)
Follow up as needed

## 2013-10-27 NOTE — Progress Notes (Signed)
Christina Greene 04/08/1981 [id number] 10/27/2013   History of Present Illness: Christina Greene is a  33 y.o. female who presents today status post lap appy by Dr. Avel Peace.  Pathology reveals acute appendicitis.  The patient is tolerating a regular diet, having normal bowel movements, has good pain control.  She  is back to most normal activities.   Physical Exam: Abd: soft, nontender, active bowel sounds, nondistended.  All incisions are well healed.  Impression: 1.  Acute appendicitis, s/p lap appy  Plan: She  is able to return to normal activities. She  may follow up on a prn basis.

## 2014-01-06 ENCOUNTER — Encounter: Payer: Self-pay | Admitting: Obstetrics & Gynecology

## 2014-01-06 ENCOUNTER — Ambulatory Visit (INDEPENDENT_AMBULATORY_CARE_PROVIDER_SITE_OTHER): Payer: Medicaid Other | Admitting: Obstetrics & Gynecology

## 2014-01-06 VITALS — BP 132/86 | HR 83 | Temp 98.4°F | Wt 195.2 lb

## 2014-01-06 DIAGNOSIS — Z30019 Encounter for initial prescription of contraceptives, unspecified: Secondary | ICD-10-CM

## 2014-01-06 DIAGNOSIS — Z3009 Encounter for other general counseling and advice on contraception: Secondary | ICD-10-CM

## 2014-01-06 DIAGNOSIS — Z3202 Encounter for pregnancy test, result negative: Secondary | ICD-10-CM

## 2014-01-06 LAB — POCT URINE PREGNANCY: Preg Test, Ur: NEGATIVE

## 2014-01-06 NOTE — Progress Notes (Signed)
Subjective:    Christina Greene is a 33 y.o. female who presents for contraception counseling. The patient has no complaints today. The patient is sexually active. Pertinent past medical history: none.    Menstrual History: OB History   Grav Para Term Preterm Abortions TAB SAB Ect Mult Living   4 1 1  0 3 0 2 1 0 1      Patient's last menstrual period was 12/13/2013.   Patient Active Problem List   Diagnosis Date Noted  . Appendicitis, acute 10/04/2013  . Appendicitis 10/04/2013  . Incomplete miscarriage 05/28/2012   Past Medical History  Diagnosis Date  . Ectopic pregnancy   . Ovarian cyst   . Abnormal Pap smear     colpo ok  . Depression     doing ok    Past Surgical History  Procedure Laterality Date  . Dilation and curettage of uterus    . Laparotomy      ruptured ectopic, rt tube; ovarian cyst  . Wisdom tooth extraction    . Nasal fracture surgery  2002  . Hand surgery Right   . Laparoscopic appendectomy N/A 10/04/2013    Procedure: APPENDECTOMY LAPAROSCOPIC;  Surgeon: Adolph Pollackodd J Rosenbower, MD;  Location: [facility];  Service: General;  Laterality: N/A;    Current outpatient prescriptions:naproxen sodium (ANAPROX) 220 MG tablet, Take 440 mg by mouth daily as needed (back pain)., Disp: , Rfl:  No Known Allergies  History  Substance Use Topics  . Smoking status: Current Some Day Smoker -- 4 years    Types: Cigars  . Smokeless tobacco: Never Used     Comment: black and milds  . Alcohol Use: Yes     Comment: occ    Family History  Problem Relation Age of Onset  . Hypertension Mother   . Hypothyroidism Mother   . Diabetes Father   . Liver disease Father     hepatitis  . Hepatitis Father   . Other Neg Hx        Review of Systems Constitutional: negative for weight loss Genitourinary:negative for abnormal menstrual periods and vaginal discharge   Objective:   BP 132/86  Pulse 83  Temp(Src) 98.4 F (36.9 C)  Wt 88.542 kg (195 lb 3.2 oz)  LMP 12/13/2013   Breastfeeding? Unknown   General:   alert  Skin:   no rash or abnormalities  Lungs:   clear to auscultation bilaterally  Heart:   regular rate and rhythm, S1, S2 normal, no murmur, click, rub or gallop  Breasts:   normal without suspicious masses, skin or nipple changes or axillary nodes  Abdomen:  normal findings: no organomegaly, soft, non-tender and no hernia  Pelvis:  External genitalia: normal general appearance Urinary system: urethral meatus normal and bladder without fullness, nontender Vaginal: normal without tenderness, induration or masses Cervix: normal appearance Adnexa: normal bimanual exam Uterus: anteverted and non-tender, normal size   Lab Review Urine pregnancy test Labs reviewed no Radiologic studies reviewed no    Assessment:    33 y.o. considering a LARC   Plan:    All questions answered. Contraception: pt Insurance underwritereducation materials provided.   Orders Placed This Encounter  Procedures  . GC/Chlamydia Probe Amp  . POCT urine pregnancy    Follow up as needed.

## 2014-01-07 LAB — GC/CHLAMYDIA PROBE AMP
CT PROBE, AMP APTIMA: NEGATIVE
GC PROBE AMP APTIMA: NEGATIVE

## 2014-01-07 NOTE — Patient Instructions (Signed)
Contraception Choices Contraception (birth control) is the use of any methods or devices to prevent pregnancy. Below are some methods to help avoid pregnancy. HORMONAL METHODS   Contraceptive implant. This is a thin, plastic tube containing progesterone hormone. It does not contain estrogen hormone. Your health care provider inserts the tube in the inner part of the upper arm. The tube can remain in place for up to 3 years. After 3 years, the implant must be removed. The implant prevents the ovaries from releasing an egg (ovulation), thickens the cervical mucus to prevent sperm from entering the uterus, and thins the lining of the inside of the uterus.  Progesterone-only injections. These injections are given every 3 months by your health care provider to prevent pregnancy. This synthetic progesterone hormone stops the ovaries from releasing eggs. It also thickens cervical mucus and changes the uterine lining. This makes it harder for sperm to survive in the uterus.  Birth control pills. These pills contain estrogen and progesterone hormone. They work by preventing the ovaries from releasing eggs (ovulation). They also cause the cervical mucus to thicken, preventing the sperm from entering the uterus. Birth control pills are prescribed by a health care provider.Birth control pills can also be used to treat heavy periods.  Minipill. This type of birth control pill contains only the progesterone hormone. They are taken every day of each month and must be prescribed by your health care provider.  Birth control patch. The patch contains hormones similar to those in birth control pills. It must be changed once a week and is prescribed by a health care provider.  Vaginal ring. The ring contains hormones similar to those in birth control pills. It is left in the vagina for 3 weeks, removed for 1 week, and then a new one is put back in place. The patient must be comfortable inserting and removing the ring  from the vagina.A health care provider's prescription is necessary.  Emergency contraception. Emergency contraceptives prevent pregnancy after unprotected sexual intercourse. This pill can be taken right after sex or up to 5 days after unprotected sex. It is most effective the sooner you take the pills after having sexual intercourse. Most emergency contraceptive pills are available without a prescription. Check with your pharmacist. Do not use emergency contraception as your only form of birth control. BARRIER METHODS   Female condom. This is a thin sheath (latex or rubber) that is worn over the penis during sexual intercourse. It can be used with spermicide to increase effectiveness.  Female condom. This is a soft, loose-fitting sheath that is put into the vagina before sexual intercourse.  Diaphragm. This is a soft, latex, dome-shaped barrier that must be fitted by a health care provider. It is inserted into the vagina, along with a spermicidal jelly. It is inserted before intercourse. The diaphragm should be left in the vagina for 6 to 8 hours after intercourse.  Cervical cap. This is a round, soft, latex or plastic cup that fits over the cervix and must be fitted by a health care provider. The cap can be left in place for up to 48 hours after intercourse.  Sponge. This is a soft, circular piece of polyurethane foam. The sponge has spermicide in it. It is inserted into the vagina after wetting it and before sexual intercourse.  Spermicides. These are chemicals that kill or block sperm from entering the cervix and uterus. They come in the form of creams, jellies, suppositories, foam, or tablets. They do not require a   prescription. They are inserted into the vagina with an applicator before having sexual intercourse. The process must be repeated every time you have sexual intercourse. INTRAUTERINE CONTRACEPTION  Intrauterine device (IUD). This is a T-shaped device that is put in a woman's uterus  during a menstrual period to prevent pregnancy. There are 2 types:  Copper IUD. This type of IUD is wrapped in copper wire and is placed inside the uterus. Copper makes the uterus and fallopian tubes produce a fluid that kills sperm. It can stay in place for 10 years.  Hormone IUD. This type of IUD contains the hormone progestin (synthetic progesterone). The hormone thickens the cervical mucus and prevents sperm from entering the uterus, and it also thins the uterine lining to prevent implantation of a fertilized egg. The hormone can weaken or kill the sperm that get into the uterus. It can stay in place for 3-5 years, depending on which type of IUD is used. PERMANENT METHODS OF CONTRACEPTION  Female tubal ligation. This is when the woman's fallopian tubes are surgically sealed, tied, or blocked to prevent the egg from traveling to the uterus.  Hysteroscopic sterilization. This involves placing a small coil or insert into each fallopian tube. Your doctor uses a technique called hysteroscopy to do the procedure. The device causes scar tissue to form. This results in permanent blockage of the fallopian tubes, so the sperm cannot fertilize the egg. It takes about 3 months after the procedure for the tubes to become blocked. You must use another form of birth control for these 3 months.  Female sterilization. This is when the female has the tubes that carry sperm tied off (vasectomy).This blocks sperm from entering the vagina during sexual intercourse. After the procedure, the man can still ejaculate fluid (semen). NATURAL PLANNING METHODS  Natural family planning. This is not having sexual intercourse or using a barrier method (condom, diaphragm, cervical cap) on days the woman could become pregnant.  Calendar method. This is keeping track of the length of each menstrual cycle and identifying when you are fertile.  Ovulation method. This is avoiding sexual intercourse during ovulation.  Symptothermal  method. This is avoiding sexual intercourse during ovulation, using a thermometer and ovulation symptoms.  Post-ovulation method. This is timing sexual intercourse after you have ovulated. Regardless of which type or method of contraception you choose, it is important that you use condoms to protect against the transmission of sexually transmitted infections (STIs). Talk with your health care provider about which form of contraception is most appropriate for you. Document Released: 05/21/2005 Document Revised: 05/26/2013 Document Reviewed: 11/13/2012 [employer] Patient Information 2015 [employer]. This information is not intended to replace advice given to you by your health care provider. Make sure you discuss any questions you have with your health care provider.  

## 2014-03-03 ENCOUNTER — Telehealth: Payer: Self-pay | Admitting: *Deleted

## 2014-03-03 NOTE — Telephone Encounter (Signed)
Patient called regarding birth control.  6:39 LM on VM to CB.

## 2014-03-08 ENCOUNTER — Ambulatory Visit: Payer: Medicaid Other | Admitting: Obstetrics & Gynecology

## 2014-03-17 NOTE — Telephone Encounter (Signed)
LM on VM to CB

## 2014-03-24 NOTE — Telephone Encounter (Signed)
No response- call refiled in chart

## 2014-03-25 ENCOUNTER — Ambulatory Visit (INDEPENDENT_AMBULATORY_CARE_PROVIDER_SITE_OTHER): Payer: Medicaid Other | Admitting: Obstetrics & Gynecology

## 2014-03-25 VITALS — BP 136/89 | HR 77 | Wt 192.0 lb

## 2014-03-25 DIAGNOSIS — A499 Bacterial infection, unspecified: Secondary | ICD-10-CM

## 2014-03-25 DIAGNOSIS — B379 Candidiasis, unspecified: Secondary | ICD-10-CM

## 2014-03-25 DIAGNOSIS — B9689 Other specified bacterial agents as the cause of diseases classified elsewhere: Secondary | ICD-10-CM

## 2014-03-25 DIAGNOSIS — N898 Other specified noninflammatory disorders of vagina: Secondary | ICD-10-CM

## 2014-03-25 DIAGNOSIS — N760 Acute vaginitis: Secondary | ICD-10-CM

## 2014-03-26 ENCOUNTER — Encounter: Payer: Self-pay | Admitting: Obstetrics & Gynecology

## 2014-03-26 LAB — WET PREP BY MOLECULAR PROBE
Candida species: NEGATIVE
Gardnerella vaginalis: POSITIVE — AB
Trichomonas vaginosis: NEGATIVE

## 2014-03-26 LAB — POCT WET PREP (WET MOUNT)
CLUE CELLS WET PREP WHIFF POC: NEGATIVE
KOH Wet Prep POC: NEGATIVE

## 2014-03-26 NOTE — Progress Notes (Signed)
Patient ID: Christina Greene, female   DOB: [birth year], 33 y.o.   MRN: [id number]    HPI Christina Greene is a 33 y.o. female.  She complains of vaginal irritation.  HPI  Past Medical History  Diagnosis Date  . Ectopic pregnancy   . Ovarian cyst   . Abnormal Pap smear     colpo ok  . Depression     doing ok    Past Surgical History  Procedure Laterality Date  . Dilation and curettage of uterus    . Laparotomy      ruptured ectopic, rt tube; ovarian cyst  . Wisdom tooth extraction    . Nasal fracture surgery  2002  . Hand surgery Right   . Laparoscopic appendectomy N/A 10/04/2013    Procedure: APPENDECTOMY LAPAROSCOPIC;  Surgeon: Adolph Pollackodd J Rosenbower, MD;  Location: [facility];  Service: General;  Laterality: N/A;    Family History  Problem Relation Age of Onset  . Hypertension Mother   . Hypothyroidism Mother   . Diabetes Father   . Liver disease Father     hepatitis  . Hepatitis Father   . Other Neg Hx     Social History History  Substance Use Topics  . Smoking status: Current Some Day Smoker -- 4 years    Types: Cigars  . Smokeless tobacco: Never Used     Comment: black and milds  . Alcohol Use: Yes     Comment: occ    No Known Allergies  Current Outpatient Prescriptions  Medication Sig Dispense Refill  . naproxen sodium (ANAPROX) 220 MG tablet Take 440 mg by mouth daily as needed (back pain).       No current facility-administered medications for this visit.    Review of Systems Review of Systems Constitutional: negative for fatigue and weight loss Respiratory: negative for cough and wheezing Cardiovascular: negative for chest pain, fatigue and palpitations Gastrointestinal: negative for abdominal pain and change in bowel habits Genitourinary: positive for vaginal irritation Integument/breast: negative for nipple discharge Musculoskeletal:negative for myalgias Neurological: negative for gait problems and tremors Behavioral/Psych: negative for abusive  relationship, depression Endocrine: negative for temperature intolerance     Blood pressure 136/89, pulse 77, weight 87.091 kg (192 lb), last menstrual period 03/06/2014, unknown if currently breastfeeding.  Physical Exam Physical Exam General:   alert  Skin:   no rash or abnormalities  Lungs:   clear to auscultation bilaterally  Heart:   regular rate and rhythm, S1, S2 normal, no murmur, click, rub or gallop  Abdomen:  normal findings: no organomegaly, soft, non-tender and no hernia  Pelvis:   anExternal genitalia: normal general appearance Urinary system: urethral meatus normal and bladder without fullness, nontender Vaginal: normal without tenderness, induration or masses Cervix: normal appearance Adnexa: normal bimanual exam Uterus: antevertedd non-tender, normal size      Data Reviewed None  Assessment    Vaginitis symptoms--?etiology    Plan    Orders Placed This Encounter  Procedures  . WET PREP BY MOLECULAR PROBE  . POCT Wet Prep [facility] [place]   Counseled re: avoidance of irritants Follow up as needed.         JACKSON-MOORE,Kohan Azizi A 03/26/2014, 10:13 PM

## 2014-03-26 NOTE — Patient Instructions (Signed)

## 2014-04-05 ENCOUNTER — Encounter: Payer: Self-pay | Admitting: Obstetrics & Gynecology

## 2014-04-28 ENCOUNTER — Ambulatory Visit (INDEPENDENT_AMBULATORY_CARE_PROVIDER_SITE_OTHER): Payer: Medicaid Other | Admitting: Obstetrics

## 2014-04-28 ENCOUNTER — Encounter: Payer: Self-pay | Admitting: Obstetrics

## 2014-04-28 VITALS — BP 128/86 | HR 76 | Temp 98.2°F | Ht 66.0 in | Wt 194.6 lb

## 2014-04-28 DIAGNOSIS — N898 Other specified noninflammatory disorders of vagina: Secondary | ICD-10-CM

## 2014-04-28 DIAGNOSIS — A499 Bacterial infection, unspecified: Secondary | ICD-10-CM

## 2014-04-28 DIAGNOSIS — B373 Candidiasis of vulva and vagina: Secondary | ICD-10-CM

## 2014-04-28 DIAGNOSIS — N760 Acute vaginitis: Secondary | ICD-10-CM

## 2014-04-28 DIAGNOSIS — B9689 Other specified bacterial agents as the cause of diseases classified elsewhere: Secondary | ICD-10-CM

## 2014-04-28 DIAGNOSIS — B3731 Acute candidiasis of vulva and vagina: Secondary | ICD-10-CM

## 2014-04-28 MED ORDER — METRONIDAZOLE 500 MG PO TABS: 500.0000 mg | ORAL_TABLET | Freq: Two times a day (BID) | ORAL | Status: DC

## 2014-04-28 MED ORDER — FLUCONAZOLE 150 MG PO TABS: 150.0000 mg | ORAL_TABLET | Freq: Once | ORAL | Status: DC

## 2014-04-28 NOTE — Progress Notes (Signed)
Patient ID: Christina Evertsshley S Rarick, female   DOB: Aug 14, 1980, 33 y.o.   MRN: [id number]  Chief Complaint  Patient presents with  . Gynecologic Exam    HPI Christina Greene is a 33 y.o. female.  Malodorous vaginal discharge.  HPI  Past Medical History  Diagnosis Date  . Ectopic pregnancy   . Ovarian cyst   . Abnormal Pap smear     colpo ok  . Depression     doing ok    Past Surgical History  Procedure Laterality Date  . Dilation and curettage of uterus    . Laparotomy      ruptured ectopic, rt tube; ovarian cyst  . Wisdom tooth extraction    . Nasal fracture surgery  2002  . Hand surgery Right   . Laparoscopic appendectomy N/A 10/04/2013    Procedure: APPENDECTOMY LAPAROSCOPIC;  Surgeon: Adolph Pollackodd J Rosenbower, MD;  Location: [facility];  Service: General;  Laterality: N/A;    Family History  Problem Relation Age of Onset  . Hypertension Mother   . Hypothyroidism Mother   . Diabetes Father   . Liver disease Father     hepatitis  . Hepatitis Father   . Other Neg Hx     Social History History  Substance Use Topics  . Smoking status: Former Smoker -- 4 years    Types: Cigars  . Smokeless tobacco: Never Used     Comment: black and milds  . Alcohol Use: 0.0 oz/week    0 Not specified per week     Comment: occ    No Known Allergies  Current Outpatient Prescriptions  Medication Sig Dispense Refill  . naproxen sodium (ANAPROX) 220 MG tablet Take 440 mg by mouth daily as needed (back pain).    . fluconazole (DIFLUCAN) 150 MG tablet Take 1 tablet (150 mg total) by mouth once. 1 tablet 2  . metroNIDAZOLE (FLAGYL) 500 MG tablet Take 1 tablet (500 mg total) by mouth 2 (two) times daily. 14 tablet 2   No current facility-administered medications for this visit.    Review of Systems Review of Systems Constitutional: negative for fatigue and weight loss Respiratory: negative for cough and wheezing Cardiovascular: negative for chest pain, fatigue and  palpitations Gastrointestinal: negative for abdominal pain and change in bowel habits Genitourinary: fishy smelling vaginal discharge Integument/breast: negative for nipple discharge Musculoskeletal:negative for myalgias Neurological: negative for gait problems and tremors Behavioral/Psych: negative for abusive relationship, depression Endocrine: negative for temperature intolerance     Blood pressure 128/86, pulse 76, temperature 98.2 F (36.8 C), height 5\' 6"  (1.676 m), weight 194 lb 9.6 oz (88.27 kg), last menstrual period 04/06/2014, not currently breastfeeding.  Physical Exam Physical Exam                Abdomen:  normal findings: no organomegaly, soft, non-tender and no hernia  Pelvis:  External genitalia: normal general appearance Urinary system: urethral meatus normal and bladder without fullness, nontender Vaginal: normal without tenderness, induration or masses.  Grey, thin malodorous discharge Cervix: normal appearance Adnexa: normal bimanual exam Uterus: anteverted and non-tender, normal size      Data Reviewed Labs  Assessment    BV     Plan    Flagyl Rx  Orders Placed This Encounter  Procedures  . WET PREP BY MOLECULAR PROBE  . GC/Chlamydia Probe Amp   Meds ordered this encounter  Medications  . metroNIDAZOLE (FLAGYL) 500 MG tablet    Sig: Take 1 tablet (500 mg total)  by mouth 2 (two) times daily.    Dispense:  14 tablet    Refill:  2  . fluconazole (DIFLUCAN) 150 MG tablet    Sig: Take 1 tablet (150 mg total) by mouth once.    Dispense:  1 tablet    Refill:  2        Raynetta Osterloh A 04/28/2014, 3:40 PM

## 2014-04-29 LAB — WET PREP BY MOLECULAR PROBE
Candida species: NEGATIVE
GARDNERELLA VAGINALIS: POSITIVE — AB
Trichomonas vaginosis: NEGATIVE

## 2014-04-29 LAB — GC/CHLAMYDIA PROBE AMP
CT Probe RNA: NEGATIVE
GC Probe RNA: NEGATIVE

## 2014-05-31 ENCOUNTER — Encounter: Payer: Self-pay | Admitting: *Deleted

## 2014-06-01 ENCOUNTER — Encounter: Payer: Self-pay | Admitting: Obstetrics & Gynecology

## 2014-07-08 ENCOUNTER — Telehealth: Payer: Self-pay | Admitting: *Deleted

## 2014-07-08 NOTE — Telephone Encounter (Signed)
Patient called the office requesting a refill on medication. Patient did not leave the name of the medication. Attempted to contact the patient and left message for patient to contact the office.

## 2014-07-16 ENCOUNTER — Other Ambulatory Visit: Payer: Self-pay | Admitting: *Deleted

## 2014-07-16 DIAGNOSIS — B379 Candidiasis, unspecified: Secondary | ICD-10-CM

## 2014-07-16 MED ORDER — TERCONAZOLE 0.4 % VA CREA: 1.0000 | TOPICAL_CREAM | Freq: Every day | VAGINAL | Status: DC

## 2014-07-16 NOTE — Telephone Encounter (Signed)
Contacted patient. Patient states she is having discharge with itching and prefers to have Terazol instead of Diflucan. Per nursing protocol prescription sent to the pharmacy. Patient advised if the symptoms persist then she will need to make an appointment.

## 2014-08-06 ENCOUNTER — Ambulatory Visit (INDEPENDENT_AMBULATORY_CARE_PROVIDER_SITE_OTHER): Payer: Medicaid Other | Admitting: Certified Nurse Midwife

## 2014-08-06 ENCOUNTER — Encounter: Payer: Self-pay | Admitting: Certified Nurse Midwife

## 2014-08-06 VITALS — BP 135/92 | HR 79 | Temp 98.9°F | Ht 66.0 in | Wt 188.0 lb

## 2014-08-06 DIAGNOSIS — A499 Bacterial infection, unspecified: Secondary | ICD-10-CM

## 2014-08-06 DIAGNOSIS — B3731 Acute candidiasis of vulva and vagina: Secondary | ICD-10-CM

## 2014-08-06 DIAGNOSIS — B373 Candidiasis of vulva and vagina: Secondary | ICD-10-CM

## 2014-08-06 DIAGNOSIS — B9689 Other specified bacterial agents as the cause of diseases classified elsewhere: Secondary | ICD-10-CM

## 2014-08-06 DIAGNOSIS — R5383 Other fatigue: Secondary | ICD-10-CM | POA: Diagnosis not present

## 2014-08-06 DIAGNOSIS — Z30011 Encounter for initial prescription of contraceptive pills: Secondary | ICD-10-CM | POA: Diagnosis not present

## 2014-08-06 DIAGNOSIS — N760 Acute vaginitis: Secondary | ICD-10-CM | POA: Diagnosis not present

## 2014-08-06 LAB — BASIC METABOLIC PANEL
BUN: 8 mg/dL (ref 6–23)
CO2: 26 mEq/L (ref 19–32)
CREATININE: 0.6 mg/dL (ref 0.50–1.10)
Calcium: 9.6 mg/dL (ref 8.4–10.5)
Chloride: 104 mEq/L (ref 96–112)
GLUCOSE: 85 mg/dL (ref 70–99)
Potassium: 3.8 mEq/L (ref 3.5–5.3)
SODIUM: 140 meq/L (ref 135–145)

## 2014-08-06 MED ORDER — LEVONORG-ETH ESTRAD TRIPHASIC PO TABS: 1.0000 | ORAL_TABLET | Freq: Every day | ORAL | Status: DC

## 2014-08-06 MED ORDER — METRONIDAZOLE 0.75 % VA GEL: 1.0000 | VAGINAL | Status: DC

## 2014-08-06 NOTE — Progress Notes (Signed)
Patient ID: Christina Greene, female   DOB: 1980-12-29, 34 y.o.   MRN: [id number] Subjective:    Christina Greene is a 34 y.o. female who presents for contraception counseling. The patient also has chronic bacterial vaginosis/vulvovaginal candidasis and desires treatment for BV. The patient is sexually active. Pertinent past medical history: none.  The information documented in the HPI was reviewed and verified.  Menstrual History: OB History    Gravida Para Term Preterm AB TAB SAB Ectopic Multiple Living   0 3 0 2 1 0 1      Menarche age: 14  Patient's last menstrual period was 07/30/2014.   There are no active problems to display for this patient.  Past Medical History  Diagnosis Date  . Ectopic pregnancy   . Ovarian cyst   . Abnormal Pap smear     colpo ok  . Depression     doing ok    Past Surgical History  Procedure Laterality Date  . Dilation and curettage of uterus    . Laparotomy      ruptured ectopic, rt tube; ovarian cyst  . Wisdom tooth extraction    . Nasal fracture surgery  2002  . Hand surgery Right   . Laparoscopic appendectomy N/A 10/04/2013    Procedure: APPENDECTOMY LAPAROSCOPIC;  Surgeon: Adolph Pollack, MD;  Location: [facility];  Service: General;  Laterality: N/A;     Current outpatient prescriptions:  .  terconazole (TERAZOL 7) 0.4 % vaginal cream, Place 1 applicator vaginally at bedtime., Disp: 45 g, Rfl: 0 .  levonorgestrel-ethinyl estradiol (ENPRESSE,TRIVORA) tablet, Take 1 tablet by mouth daily., Disp: 1 Package, Rfl: 11 .  metroNIDAZOLE (METROGEL VAGINAL) 0.75 % vaginal gel, Place 1 Applicatorful vaginally 2 (two) times a week. For 4-6 mo., Disp: 70 g, Rfl: 3 No Known Allergies  History  Substance Use Topics  . Smoking status: Former Smoker -- 4 years    Types: Cigars  . Smokeless tobacco: Never Used     Comment: black and milds  . Alcohol Use: 0.0 oz/week    0 Standard drinks or equivalent per week     Comment: occ    Family  History  Problem Relation Age of Onset  . Hypertension Mother   . Hypothyroidism Mother   . Diabetes Father   . Liver disease Father     hepatitis  . Hepatitis Father   . Other Neg Hx        Review of Systems Constitutional: negative for weight loss Genitourinary:negative for abnormal menstrual periods. Has chronic bacterial vaginosis along with vulvovaginal candidiasis.     Objective:   BP 135/92 mmHg  Pulse 79  Temp(Src) 98.9 F (37.2 C)  Ht  (1.676 m)  Wt 85.276 kg (188 lb)  BMI 30.36 kg/m2  LMP 07/30/2014   General:   alert  Skin:   no rash or abnormalities  Lungs:   clear to auscultation bilaterally  Heart:   regular rate and rhythm, S1, S2 normal, no murmur, click, rub or gallop  Abdomen:  normal findings: no organomegaly, soft, non-tender and no hernia   Lab Review Urine pregnancy test Labs reviewed yes   95% of 15 min visit spent on counseling and coordination of care.   Assessment:    34 y.o., starting OCP (estrogen/progesterone), no contraindications.   Plan:    All questions answered. Blood tests: Basic metabolic panel, CBC with diff and HgBA1C. Contraception: OCP (estrogen/progesterone). Follow up in 3 months.  With annual exam/OCP F/U.   Meds ordered this encounter  Medications  . metroNIDAZOLE (METROGEL VAGINAL) 0.75 % vaginal gel    Sig: Place 1 Applicatorful vaginally 2 (two) times a week. For 4-6 mo.    Dispense:  70 g    Refill:  3  . levonorgestrel-ethinyl estradiol (ENPRESSE,TRIVORA) tablet    Sig: Take 1 tablet by mouth daily.    Dispense:  1 Package    Refill:  11   Orders Placed This Encounter  Procedures  . CBC with Differential/Platelet  . Basic metabolic panel  . Hemoglobin A1c

## 2014-08-06 NOTE — Addendum Note (Signed)
Addended by: Marya LandryFOSTER, Shina Wass D on: 08/06/2014 02:07 PM   Modules accepted: Orders

## 2014-08-07 LAB — CBC WITH DIFFERENTIAL/PLATELET
BASOS ABS: 0 10*3/uL (ref 0.0–0.1)
BASOS PCT: 1 % (ref 0–1)
EOS ABS: 0.1 10*3/uL (ref 0.0–0.7)
EOS PCT: 2 % (ref 0–5)
HCT: 40.2 % (ref 36.0–46.0)
Hemoglobin: 12.7 g/dL (ref 12.0–15.0)
Lymphocytes Relative: 39 % (ref 12–46)
Lymphs Abs: 1.8 10*3/uL (ref 0.7–4.0)
MCH: 23 pg — AB (ref 26.0–34.0)
MCHC: 31.6 g/dL (ref 30.0–36.0)
MCV: 72.8 fL — AB (ref 78.0–100.0)
MPV: 10.9 fL (ref 8.6–12.4)
Monocytes Absolute: 0.5 10*3/uL (ref 0.1–1.0)
Monocytes Relative: 11 % (ref 3–12)
Neutro Abs: 2.2 10*3/uL (ref 1.7–7.7)
Neutrophils Relative %: 47 % (ref 43–77)
PLATELETS: 278 10*3/uL (ref 150–400)
RBC: 5.52 MIL/uL — ABNORMAL HIGH (ref 3.87–5.11)
RDW: 16 % — ABNORMAL HIGH (ref 11.5–15.5)
WBC: 4.6 10*3/uL (ref 4.0–10.5)

## 2014-08-07 LAB — HEMOGLOBIN A1C
HEMOGLOBIN A1C: 5 % (ref <5.7)
MEAN PLASMA GLUCOSE: 97 mg/dL (ref <117)

## 2014-08-09 ENCOUNTER — Other Ambulatory Visit: Payer: Self-pay | Admitting: *Deleted

## 2014-08-09 DIAGNOSIS — B9689 Other specified bacterial agents as the cause of diseases classified elsewhere: Secondary | ICD-10-CM

## 2014-08-09 DIAGNOSIS — N760 Acute vaginitis: Secondary | ICD-10-CM

## 2014-08-09 DIAGNOSIS — Z30011 Encounter for initial prescription of contraceptive pills: Secondary | ICD-10-CM

## 2014-08-09 MED ORDER — LEVONORG-ETH ESTRAD TRIPHASIC PO TABS
1.0000 | ORAL_TABLET | Freq: Every day | ORAL | Status: DC
Start: 2014-08-09 — End: 2015-09-10

## 2014-08-09 MED ORDER — METRONIDAZOLE 0.75 % VA GEL: 1.0000 | VAGINAL | Status: DC

## 2014-10-01 ENCOUNTER — Other Ambulatory Visit: Payer: Self-pay | Admitting: *Deleted

## 2014-10-01 DIAGNOSIS — B379 Candidiasis, unspecified: Secondary | ICD-10-CM

## 2014-10-01 MED ORDER — TERCONAZOLE 0.4 % VA CREA: 1.0000 | TOPICAL_CREAM | Freq: Every day | VAGINAL | Status: DC

## 2014-10-19 ENCOUNTER — Ambulatory Visit: Payer: Medicaid Other | Admitting: Certified Nurse Midwife

## 2014-10-27 ENCOUNTER — Encounter (HOSPITAL_BASED_OUTPATIENT_CLINIC_OR_DEPARTMENT_OTHER): Payer: Self-pay | Admitting: *Deleted

## 2014-10-27 DIAGNOSIS — R1084 Generalized abdominal pain: Secondary | ICD-10-CM | POA: Diagnosis present

## 2014-10-27 DIAGNOSIS — R110 Nausea: Secondary | ICD-10-CM | POA: Diagnosis not present

## 2014-10-27 NOTE — ED Notes (Signed)
Pt c/o generalized abd pain, nausea, and runny stools x3 days.

## 2014-10-28 ENCOUNTER — Emergency Department (HOSPITAL_BASED_OUTPATIENT_CLINIC_OR_DEPARTMENT_OTHER)
Admission: EM | Admit: 2014-10-28 | Discharge: 2014-10-28 | Discharge status: Against Medical Advice | Payer: Medicaid Other | Attending: Emergency Medicine | Admitting: Emergency Medicine

## 2014-10-28 LAB — URINALYSIS, ROUTINE W REFLEX MICROSCOPIC
Bilirubin Urine: NEGATIVE
Glucose, UA: NEGATIVE mg/dL
Ketones, ur: NEGATIVE mg/dL
LEUKOCYTES UA: NEGATIVE
Nitrite: NEGATIVE
PH: 6 (ref 5.0–8.0)
Protein, ur: NEGATIVE mg/dL
SPECIFIC GRAVITY, URINE: 1.014 (ref 1.005–1.030)
Urobilinogen, UA: 0.2 mg/dL (ref 0.0–1.0)

## 2014-10-28 LAB — URINE MICROSCOPIC-ADD ON

## 2014-10-28 LAB — PREGNANCY, URINE: Preg Test, Ur: NEGATIVE

## 2014-11-03 ENCOUNTER — Ambulatory Visit (INDEPENDENT_AMBULATORY_CARE_PROVIDER_SITE_OTHER): Payer: Medicaid Other | Admitting: Certified Nurse Midwife

## 2014-11-03 ENCOUNTER — Encounter: Payer: Self-pay | Admitting: Certified Nurse Midwife

## 2014-11-03 VITALS — BP 130/87 | HR 83 | Temp 98.8°F | Ht 66.0 in | Wt 189.0 lb

## 2014-11-03 DIAGNOSIS — R7303 Prediabetes: Secondary | ICD-10-CM

## 2014-11-03 DIAGNOSIS — A499 Bacterial infection, unspecified: Secondary | ICD-10-CM | POA: Diagnosis not present

## 2014-11-03 DIAGNOSIS — N760 Acute vaginitis: Secondary | ICD-10-CM | POA: Diagnosis not present

## 2014-11-03 DIAGNOSIS — Z113 Encounter for screening for infections with a predominantly sexual mode of transmission: Secondary | ICD-10-CM

## 2014-11-03 DIAGNOSIS — J069 Acute upper respiratory infection, unspecified: Secondary | ICD-10-CM | POA: Diagnosis not present

## 2014-11-03 DIAGNOSIS — Z0000 Encounter for general adult medical examination without abnormal findings: Secondary | ICD-10-CM

## 2014-11-03 DIAGNOSIS — R7309 Other abnormal glucose: Secondary | ICD-10-CM

## 2014-11-03 DIAGNOSIS — B9689 Other specified bacterial agents as the cause of diseases classified elsewhere: Secondary | ICD-10-CM

## 2014-11-03 DIAGNOSIS — E669 Obesity, unspecified: Secondary | ICD-10-CM

## 2014-11-03 DIAGNOSIS — K529 Noninfective gastroenteritis and colitis, unspecified: Secondary | ICD-10-CM

## 2014-11-03 DIAGNOSIS — Z3049 Encounter for surveillance of other contraceptives: Secondary | ICD-10-CM

## 2014-11-03 MED ORDER — NORGESTIM-ETH ESTRAD TRIPHASIC 0.18/0.215/0.25 MG-35 MCG PO TABS: 1.0000 | ORAL_TABLET | Freq: Every day | ORAL | Status: DC

## 2014-11-03 MED ORDER — PSEUDOEPH-BROMPHEN-DM 30-2-10 MG/5ML PO SYRP: 5.0000 mL | ORAL_SOLUTION | Freq: Four times a day (QID) | ORAL | Status: DC | PRN

## 2014-11-03 MED ORDER — DM-GUAIFENESIN ER 60-1200 MG PO TB12: 1.0000 | ORAL_TABLET | Freq: Two times a day (BID) | ORAL | Status: DC

## 2014-11-03 MED ORDER — BENZONATATE 100 MG PO CAPS: 100.0000 mg | ORAL_CAPSULE | Freq: Three times a day (TID) | ORAL | Status: DC | PRN

## 2014-11-03 MED ORDER — LOPERAMIDE-SIMETHICONE 2-125 MG PO TABS: ORAL_TABLET | ORAL | Status: DC

## 2014-11-03 NOTE — Patient Instructions (Signed)
Chronic Diarrhea Diarrhea is frequent loose and watery bowel movements. It can cause you to feel weak and dehydrated. Dehydration can cause you to become tired and thirsty and to have a dry mouth, decreased urination, and dark yellow urine. Diarrhea is a sign of another problem, most often an infection that will not last long. In most cases, diarrhea lasts 2-3 days. Diarrhea that lasts longer than 4 weeks is called long-lasting (chronic) diarrhea. It is important to treat your diarrhea as directed by your health care provider to lessen or prevent future episodes of diarrhea.  CAUSES  There are many causes of chronic diarrhea. The following are some possible causes:   Gastrointestinal infections caused by viruses, bacteria, or parasites.   Food poisoning or food allergies.   Certain medicines, such as antibiotics, chemotherapy, and laxatives.   Artificial sweeteners and fructose.   Digestive disorders, such as celiac disease and inflammatory bowel diseases.   Irritable bowel syndrome.  Some disorders of the pancreas.  Disorders of the thyroid.  Reduced blood flow to the intestines.  Cancer. Sometimes the cause of chronic diarrhea is unknown. RISK FACTORS  Having a severely weakened immune system, such as from HIV or AIDS.   Taking certain types of cancer-fighting drugs (such as with chemotherapy) or other medicines.   Having had a recent organ transplant.   Having a portion of the stomach or small bowel removed.   Traveling to countries where food and water supplies are often contaminated.  SYMPTOMS  In addition to frequent, loose stools, diarrhea may cause:   Cramping.   Abdominal pain.   Nausea.   Fever.  Fatigue.  Urgent need to use the bathroom.  Loss of bowel control. DIAGNOSIS  Your health care provider must take a careful history and perform a physical exam. Tests given are based on your symptoms and history. Tests may include:   Blood or  stool tests. Three or more stool samples may be examined. Stool cultures may be used to test for bacteria or parasites.   X-rays.   A procedure in which a thin tube is inserted into the mouth or rectum (endoscopy). This allows the health care provider to look inside the intestine.  TREATMENT   Treatment is aimed at correcting the cause of the diarrhea when possible.  Diarrhea caused by an infection can often be treated with antibiotic medicines.  Diarrhea not caused by an infection may require you to take long-term medicine or have surgery. Specific treatment should be discussed with your health care provider.  If the cause cannot be determined, treatment aims to relieve symptoms and prevent dehydration. Serious health problems can occur if you do not maintain proper fluid levels. Treatment may include:  Taking an oral rehydration solution (ORS).  Not drinking beverages that contain caffeine (such as tea, coffee, and soft drinks).  Not drinking alcohol.  Maintaining well-balanced nutrition to help you recover faster. HOME CARE INSTRUCTIONS   Drink enough fluids to keep urine clear or pale yellow. Drink 1 cup (8 oz) of fluid for each diarrhea episode. Avoid fluids that contain simple sugars, fruit juices, whole milk products, and sodas. Hydrate with an ORS. You may purchase the ORS or prepare it at home by mixing the following ingredients together:   - tsp (1.7-3  mL) table salt.   tsp (3  mL) baking soda.   tsp (1.7 mL) salt substitute containing potassium chloride.  1 tbsp (20 mL) sugar.  4.2 c (1 L) of water.     Certain foods and beverages may increase the speed at which food moves through the gastrointestinal (GI) tract. These foods and beverages should be avoided. They include:  Caffeinated and alcoholic beverages.  High-fiber foods, such as raw fruits and vegetables, nuts, seeds, and whole grain breads and cereals.  Foods and beverages sweetened with sugar  alcohols, such as xylitol, sorbitol, and mannitol.   Some foods may be well tolerated and may help thicken stool. These include:  Starchy foods, such as rice, toast, pasta, low-sugar cereal, oatmeal, grits, baked potatoes, crackers, and bagels.  Bananas.  Applesauce.  Add probiotic-rich foods to help increase healthy bacteria in the GI tract. These include yogurt and fermented milk products.  Wash your hands well after each diarrhea episode.  Only take over-the-counter or prescription medicines as directed by your health care provider.  Take a warm bath to relieve any burning or pain from frequent diarrhea episodes. SEEK MEDICAL CARE IF:   You are not urinating as often.  Your urine is a dark color.  You become very tired or dizzy.  You have severe pain in the abdomen or rectum.  Your have blood or pus in your stools.  Your stools look black and tarry. SEEK IMMEDIATE MEDICAL CARE IF:   You are unable to keep fluids down.  You have persistent vomiting.  You have blood in your stool.  Your stools are black and tarry.  You do not urinate in 6-8 hours, or there is only a small amount of very dark urine.  You have abdominal pain that increases or localizes.  You have weakness, dizziness, confusion, or lightheadedness.  You have a severe headache.  Your diarrhea gets worse or does not get better.  You have a fever or persistent symptoms for more than 2-3 days.  You have a fever and your symptoms suddenly get worse. MAKE SURE YOU:   Understand these instructions.  Will watch your condition.  Will get help right away if you are not doing well or get worse. Document Released: 08/11/2003 Document Revised: 05/26/2013 Document Reviewed: 11/13/2012 [facility] Patient Information 2015 [place], [place]. This information is not intended to replace advice given to you by your health care provider. Make sure you discuss any questions you have with your health care  provider. Upper Respiratory Infection, Adult An upper respiratory infection (URI) is also sometimes known as the common cold. The upper respiratory tract includes the nose, sinuses, throat, trachea, and bronchi. Bronchi are the airways leading to the lungs. Most people improve within 1 week, but symptoms can last up to 2 weeks. A residual cough may last even longer.  CAUSES Many different viruses can infect the tissues lining the upper respiratory tract. The tissues become irritated and inflamed and often become very moist. Mucus production is also common. A cold is contagious. You can easily spread the virus to others by oral contact. This includes kissing, sharing a glass, coughing, or sneezing. Touching your mouth or nose and then touching a surface, which is then touched by another person, can also spread the virus. SYMPTOMS  Symptoms typically develop 1 to 3 days after you come in contact with a cold virus. Symptoms vary from person to person. They may include:  Runny nose.  Sneezing.  Nasal congestion.  Sinus irritation.  Sore throat.  Loss of voice (laryngitis).  Cough.  Fatigue.  Muscle aches.  Loss of appetite.  Headache.  Low-grade fever. DIAGNOSIS  You might diagnose your own cold based on familiar symptoms, since  most people get a cold 2 to 3 times a year. Your caregiver can confirm this based on your exam. Most importantly, your caregiver can check that your symptoms are not due to another disease such as strep throat, sinusitis, pneumonia, asthma, or epiglottitis. Blood tests, throat tests, and X-rays are not necessary to diagnose a common cold, but they may sometimes be helpful in excluding other more serious diseases. Your caregiver will decide if any further tests are required. RISKS AND COMPLICATIONS  You may be at risk for a more severe case of the common cold if you smoke cigarettes, have chronic heart disease (such as heart failure) or lung disease (such as  asthma), or if you have a weakened immune system. The very young and very old are also at risk for more serious infections. Bacterial sinusitis, middle ear infections, and bacterial pneumonia can complicate the common cold. The common cold can worsen asthma and chronic obstructive pulmonary disease (COPD). Sometimes, these complications can require emergency medical care and may be life-threatening. PREVENTION  The best way to protect against getting a cold is to practice good hygiene. Avoid oral or hand contact with people with cold symptoms. Wash your hands often if contact occurs. There is no clear evidence that vitamin C, vitamin E, echinacea, or exercise reduces the chance of developing a cold. However, it is always recommended to get plenty of rest and practice good nutrition. TREATMENT  Treatment is directed at relieving symptoms. There is no cure. Antibiotics are not effective, because the infection is caused by a virus, not by bacteria. Treatment may include:  Increased fluid intake. Sports drinks offer valuable electrolytes, sugars, and fluids.  Breathing heated mist or steam (vaporizer or shower).  Eating chicken soup or other clear broths, and maintaining good nutrition.  Getting plenty of rest.  Using gargles or lozenges for comfort.  Controlling fevers with ibuprofen or acetaminophen as directed by your caregiver.  Increasing usage of your inhaler if you have asthma. Zinc gel and zinc lozenges, taken in the first 24 hours of the common cold, can shorten the duration and lessen the severity of symptoms. Pain medicines may help with fever, muscle aches, and throat pain. A variety of non-prescription medicines are available to treat congestion and runny nose. Your caregiver can make recommendations and may suggest nasal or lung inhalers for other symptoms.  HOME CARE INSTRUCTIONS   Only take over-the-counter or prescription medicines for pain, discomfort, or fever as directed by your  caregiver.  Use a warm mist humidifier or inhale steam from a shower to increase air moisture. This may keep secretions moist and make it easier to breathe.  Drink enough water and fluids to keep your urine clear or pale yellow.  Rest as needed.  Return to work when your temperature has returned to normal or as your caregiver advises. You may need to stay home longer to avoid infecting others. You can also use a face mask and careful hand washing to prevent spread of the virus. SEEK MEDICAL CARE IF:   After the first few days, you feel you are getting worse rather than better.  You need your caregiver's advice about medicines to control symptoms.  You develop chills, worsening shortness of breath, or brown or red sputum. These may be signs of pneumonia.  You develop yellow or brown nasal discharge or pain in the face, especially when you bend forward. These may be signs of sinusitis.  You develop a fever, swollen neck glands, pain with  swallowing, or white areas in the back of your throat. These may be signs of strep throat. SEEK IMMEDIATE MEDICAL CARE IF:   You have a fever.  You develop severe or persistent headache, ear pain, sinus pain, or chest pain.  You develop wheezing, a prolonged cough, cough up blood, or have a change in your usual mucus (if you have chronic lung disease).  You develop sore muscles or a stiff neck. Document Released: 11/14/2000 Document Revised: 08/13/2011 Document Reviewed: 08/26/2013 [facility] Patient Information 2015 [place], [place]. This information is not intended to replace advice given to you by your health care provider. Make sure you discuss any questions you have with your health care provider.

## 2014-11-03 NOTE — Progress Notes (Signed)
Patient ID: Christina Greene, female   DOB: 08/06/1980, 34 y.o.   MRN: [id number]   Subjective:      Christina Greene is a 34 y.o. female here for a routine exam.  Current complaints: cough for one week, productive clear/yellow.  Has taken an OTC cough medication.  Has had diarrhea up to 6 times per day for the last 2 weeks.  Has been seen in the ED for this complaint with dx of IBS.  Menses are regular monthly, lasting 1-2 days, denies any clots/heavy bleeding.  Desires to change her birth control due to vaginal dryness.  Does not want an IUD, implant, patch, ring.  Father passed away in 2022/06/24 from hepatic carcinoma d/t chronic drug use and hepatitis.   Desires STD screening.     Personal health questionnaire:  Is patient Ashkenazi Jewish, have a family history of breast and/or ovarian cancer: no Is there a family history of uterine cancer diagnosed at age < 2, gastrointestinal cancer, urinary tract cancer, family member who is a Personnel officer syndrome-associated carrier: no Is the patient overweight and hypertensive, family history of diabetes, personal history of gestational diabetes, preeclampsia or PCOS: yes Is patient over 71, have PCOS,  family history of premature CHD under age 69, diabetes, smoke, have hypertension or peripheral artery disease:  no At any time, has a partner hit, kicked or otherwise hurt or frightened you?: no Over the past 2 weeks, have you felt down, depressed or hopeless?: no Over the past 2 weeks, have you felt little interest or pleasure in doing things?:no   Gynecologic History Patient's last menstrual period was 10/24/2014. Contraception: none Last Pap: unknown. Results were: normal according to the patient, had hx of abnormal pap smears when she was younger Last mammogram: N/A.   Obstetric History OB History  Gravida Para Term Preterm AB SAB TAB Ectopic Multiple Living  0 3 2 0 1 0 1    # Outcome Date GA Lbr Len/2nd Weight Sex Delivery Anes PTL Lv  4  SAB 05/04/12 [redacted]w[redacted]d            Comments: System Generated. Please review and update pregnancy details.  3 Ectopic 2005          2 Term 10/23/02    M Vag-Spont EPI N Y  1 SAB 2002              Past Medical History  Diagnosis Date  . Ectopic pregnancy   . Ovarian cyst   . Abnormal Pap smear     colpo ok  . Depression     doing ok    Past Surgical History  Procedure Laterality Date  . Dilation and curettage of uterus    . Laparotomy      ruptured ectopic, rt tube; ovarian cyst  . Wisdom tooth extraction    . Nasal fracture surgery  2002  . Hand surgery Right   . Laparoscopic appendectomy N/A 10/04/2013    Procedure: APPENDECTOMY LAPAROSCOPIC;  Surgeon: Adolph Pollack, MD;  Location: [facility];  Service: General;  Laterality: N/A;  . Appendectomy       Current outpatient prescriptions:  .  dicyclomine (BENTYL) 20 MG tablet, Take 20 mg by mouth every 6 (six) hours., Disp: , Rfl:  .  metroNIDAZOLE (METROGEL VAGINAL) 0.75 % vaginal gel, Place 1 Applicatorful vaginally 2 (two) times a week. For 4-6 mo., Disp: 70 g, Rfl: 3 .  terconazole (TERAZOL 7) 0.4 % vaginal cream,  Place 1 applicator vaginally at bedtime., Disp: 45 g, Rfl: 2 .  benzonatate (TESSALON PERLES) 100 MG capsule, Take 1 capsule (100 mg total) by mouth 3 (three) times daily as needed for cough., Disp: 45 capsule, Rfl: 0 .  brompheniramine-pseudoephedrine-DM (BROMFED DM) 30-2-10 MG/5ML syrup, Take 5 mLs by mouth 4 (four) times daily as needed., Disp: 118 mL, Rfl: 0 .  Dextromethorphan-Guaifenesin 60-1200 MG per 12 hr tablet, Take 1 tablet by mouth every 12 (twelve) hours., Disp: 60 tablet, Rfl: 1 .  levonorgestrel-ethinyl estradiol (ENPRESSE,TRIVORA) tablet, Take 1 tablet by mouth daily. (Patient not taking: Reported on 11/03/2014), Disp: 1 Package, Rfl: 11 .  Loperamide-Simethicone 2-125 MG TABS, Take 2 tablets with the first loose stool and then one tablet with each additional stool for a maximum of 16 mg., Disp: 30 each, Rfl:  2 .  Norgestimate-Ethinyl Estradiol Triphasic (TRI-SPRINTEC) 0.18/0.215/0.25 MG-35 MCG tablet, Take 1 tablet by mouth daily., Disp: 1 Package, Rfl: 11 No Known Allergies  History  Substance Use Topics  . Smoking status: Former Smoker -- 4 years    Types: Cigars  . Smokeless tobacco: Never Used     Comment: black and milds  . Alcohol Use: 0.0 oz/week    0 Standard drinks or equivalent per week     Comment: occ    Family History  Problem Relation Age of Onset  . Hypertension Mother   . Hypothyroidism Mother   . Diabetes Father   . Liver disease Father     hepatitis  . Hepatitis Father   . Other Neg Hx       Review of Systems  Constitutional: negative for fatigue and weight loss Respiratory: negative for cough and wheezing Cardiovascular: negative for chest pain, fatigue and palpitations Gastrointestinal: + for abdominal pain and change in bowel habits Musculoskeletal:negative for myalgias Neurological: negative for gait problems and tremors Behavioral/Psych: negative for abusive relationship, depression Endocrine: negative for temperature intolerance   Genitourinary:negative for abnormal menstrual periods, genital lesions, hot flashes, sexual problems and vaginal discharge, under going chronic BV treatment with Metrogel and doing well with it.  Integument/breast: negative for breast lump, breast tenderness, nipple discharge and skin lesion(s)    Objective:       BP 130/87 mmHg  Pulse 83  Temp(Src) 98.8 F (37.1 C)  Ht 5\' 6"  (1.676 m)  Wt 189 lb (85.73 kg)  BMI 30.52 kg/m2  LMP 10/24/2014 General:   alert  Skin:   no rash or abnormalities  Lungs:   clear to auscultation bilaterally  Heart:   regular rate and rhythm, S1, S2 normal, no murmur, click, rub or gallop  Breasts:   normal without suspicious masses, skin or nipple changes or axillary nodes  Abdomen:  normal findings: no organomegaly, soft, non-tender and no hernia obese  Pelvis:  External genitalia:  normal general appearance Urinary system: urethral meatus normal and bladder without fullness, nontender Vaginal: normal without tenderness, induration or masses Cervix: normal appearance, no CMT, large cervix slightly low in the vaginal vault Adnexa: normal bimanual exam Uterus: midline and non-tender, normal size   Lab Review Urine pregnancy test Labs reviewed yes Radiologic studies reviewed no  50% of 30 min visit spent on counseling and coordination of care.    Assessment:    Healthy female exam.  Obesity/Pre-diabeties URI X 7days Current tx for chronic BV with Metrogel Chronic diarrhea vs. IBS Contraception counseling  STD screening  Plan:    Education reviewed: depression evaluation, low fat, low cholesterol diet,  safe sex/STD prevention, self breast exams, skin cancer screening and weight bearing exercise. Contraception: OCP (estrogen/progesterone). Follow up in: 1 year.   Meds ordered this encounter  Medications  . dicyclomine (BENTYL) 20 MG tablet    Sig: Take 20 mg by mouth every 6 (six) hours.  . Loperamide-Simethicone 2-125 MG TABS    Sig: Take 2 tablets with the first loose stool and then one tablet with each additional stool for a maximum of 16 mg.    Dispense:  30 each    Refill:  2   No orders of the defined types were placed in this encounter.

## 2014-11-04 LAB — RPR

## 2014-11-04 LAB — HIV ANTIBODY (ROUTINE TESTING W REFLEX): HIV 1&2 Ab, 4th Generation: NONREACTIVE

## 2014-11-04 LAB — PAP IG AND HPV HIGH-RISK: HPV DNA HIGH RISK: NOT DETECTED

## 2014-11-04 LAB — HEPATITIS C ANTIBODY: HCV Ab: NEGATIVE

## 2014-11-04 LAB — HEPATITIS B SURFACE ANTIGEN: HEP B S AG: NEGATIVE

## 2014-11-04 LAB — TSH: TSH: 0.512 u[IU]/mL (ref 0.350–4.500)

## 2014-11-05 ENCOUNTER — Ambulatory Visit (INDEPENDENT_AMBULATORY_CARE_PROVIDER_SITE_OTHER): Payer: Medicaid Other | Admitting: Gastroenterology

## 2014-11-05 ENCOUNTER — Encounter: Payer: Self-pay | Admitting: Gastroenterology

## 2014-11-05 VITALS — BP 136/84 | HR 100 | Ht 66.0 in | Wt 191.2 lb

## 2014-11-05 DIAGNOSIS — R197 Diarrhea, unspecified: Secondary | ICD-10-CM | POA: Diagnosis not present

## 2014-11-05 LAB — SURESWAB, VAGINOSIS/VAGINITIS PLUS
ATOPOBIUM VAGINAE: DETECTED Log (cells/mL)
BV CATEGORY: UNDETERMINED — AB
C. ALBICANS, DNA: NOT DETECTED
C. PARAPSILOSIS, DNA: NOT DETECTED
C. glabrata, DNA: NOT DETECTED
C. trachomatis RNA, TMA: NOT DETECTED
C. tropicalis, DNA: NOT DETECTED
Gardnerella vaginalis: 7 Log (cells/mL)
LACTOBACILLUS SPECIES: 6.4 Log (cells/mL)
MEGASPHAERA SPECIES: NOT DETECTED Log (cells/mL)
N. gonorrhoeae RNA, TMA: NOT DETECTED
T. VAGINALIS RNA, QL TMA: NOT DETECTED

## 2014-11-05 NOTE — Patient Instructions (Signed)
Slowly come off of the bentyl (by one pill every other day).  Call of your diarrhea returns. Would check stool pathogen panel, sed rate, cbc, bmet if that occurs. And would likely also start flagyl empirically.

## 2014-11-05 NOTE — Progress Notes (Signed)
HPI: This is a  very pleasant 34 year old woman   who was referred to me by Roe Coombs, CNM  to evaluate  diarrhea .    Chief complaint is diarrhea, acute  Diarrhea.  This started about 2 weeks ago, 5-6 loose, watery diarrhea, including nocturnal symptoms at times.Marland Kitchen  Has been having fleeting, sharp pains around the same time.  No abx for the past several months  No travel.  No sick contacts.  + nausea.  Normal bowel are qod.    Clearly her bowels are improving.  Review of systems: Pertinent positive and negative review of systems were noted in the above HPI section. Complete review of systems was performed and was otherwise normal.   Past Medical History  Diagnosis Date  . Ectopic pregnancy   . Ovarian cyst   . Abnormal Pap smear     colpo ok  . Depression     doing ok    Past Surgical History  Procedure Laterality Date  . Dilation and curettage of uterus    . Laparotomy      ruptured ectopic, rt tube; ovarian cyst  . Wisdom tooth extraction    . Nasal fracture surgery  2002  . Hand surgery Right   . Laparoscopic appendectomy N/A 10/04/2013    Procedure: APPENDECTOMY LAPAROSCOPIC;  Surgeon: Adolph Pollack, MD;  Location: [facility];  Service: General;  Laterality: N/A;    Current Outpatient Prescriptions  Medication Sig Dispense Refill  . brompheniramine-pseudoephedrine-DM (BROMFED DM) 30-2-10 MG/5ML syrup Take 5 mLs by mouth 4 (four) times daily as needed. 118 mL 0  . dicyclomine (BENTYL) 20 MG tablet Take 20 mg by mouth every 6 (six) hours.    Marland Kitchen levonorgestrel-ethinyl estradiol (ENPRESSE,TRIVORA) tablet Take 1 tablet by mouth daily. (Patient not taking: Reported on 11/03/2014) 1 Package 11  . metroNIDAZOLE (METROGEL VAGINAL) 0.75 % vaginal gel Place 1 Applicatorful vaginally 2 (two) times a week. For 4-6 mo. 70 g 3  . terconazole (TERAZOL 7) 0.4 % vaginal cream Place 1 applicator vaginally at bedtime. 45 g 2   No current facility-administered medications  for this visit.    Allergies as of 11/05/2014  . (No Known Allergies)    Family History  Problem Relation Age of Onset  . Hypertension Mother   . Hypothyroidism Mother   . Diabetes Father   . Liver cancer Father   . Hepatitis Father   . Other Neg Hx     History   Social History  . Marital Status: Legally Separated    Spouse Name: N/A  . Number of Children: N/A  . Years of Education: N/A   Occupational History  . CNA    Social History Main Topics  . Smoking status: Former Smoker -- 4 years    Types: Cigars  . Smokeless tobacco: Never Used     Comment: black and milds  . Alcohol Use: 0.0 oz/week    0 Standard drinks or equivalent per week     Comment: occ  . Drug Use: No  . Sexual Activity:    Partners: Male    Birth Control/ Protection: None   Other Topics Concern  . Not on file   Social History Narrative     Physical Exam: BP 128/90 mmHg  Pulse 100  Ht  (1.676 m)  Wt 191 lb 4 oz (86.75 kg)  BMI 30.88 kg/m2  LMP 10/24/2014 Constitutional: generally well-appearing Psychiatric: alert and oriented x3 Eyes: extraocular movements intact Mouth: oral  pharynx moist, no lesions Neck: supple no lymphadenopathy Cardiovascular: heart regular rate and rhythm Lungs: clear to auscultation bilaterally Abdomen: soft, nontender, nondistended, no obvious ascites, no peritoneal signs, normal bowel sounds Extremities: no lower extremity edema bilaterally Skin: no lesions on visible extremities   Assessment and plan: 34 y.o. female with  acute diarrhea, improving  Her diarrhea symptoms are clearly improving over the past 2-3 days. Perhaps this is because of the Bentyl which she has been taking. She says she is feeling overall better. I suspect this was from infectious etiology. She is improving already. I recommended she wean off the anti-spasmodic. If her diarrhea returns she will call. At that point I would order stool tests, lab tests and probably also start her  on empiric anti-biotics She understands that if that is the case and she fails to respond and she might also need colonoscopy.   Rob Buntinganiel Jacobs, MD [facility] 11/05/2014, 3:04 PM  Cc: Roe Coombsenney, Rachelle A, CNM

## 2014-11-09 ENCOUNTER — Other Ambulatory Visit: Payer: Self-pay | Admitting: Certified Nurse Midwife

## 2014-11-11 ENCOUNTER — Ambulatory Visit: Payer: Medicaid Other | Admitting: Dietician

## 2014-11-12 ENCOUNTER — Telehealth: Payer: Self-pay | Admitting: Gastroenterology

## 2014-11-12 DIAGNOSIS — R197 Diarrhea, unspecified: Secondary | ICD-10-CM

## 2014-11-15 MED ORDER — METRONIDAZOLE 250 MG PO TABS: 250.0000 mg | ORAL_TABLET | Freq: Three times a day (TID) | ORAL | Status: DC

## 2014-11-15 NOTE — Telephone Encounter (Signed)
Pt has been notified and will begin flagyl after turning in stool samples

## 2014-11-15 NOTE — Telephone Encounter (Signed)
Ok, she needs stool pathogen panel, ESR, CBC, CMET.  After stool sample given she can start flagyl 267m pill, one pill tid for 7 days, no refills.  Thanks

## 2014-11-15 NOTE — Telephone Encounter (Signed)
Per Dr Christella Hartigan last office note: " If her diarrhea returns she will call. At that point I would order stool tests, lab tests and probably also start her on empiric anti-biotics ".

## 2014-11-15 NOTE — Telephone Encounter (Signed)
Dr Jacobs please advise  

## 2014-11-16 ENCOUNTER — Other Ambulatory Visit (INDEPENDENT_AMBULATORY_CARE_PROVIDER_SITE_OTHER): Payer: Medicaid Other

## 2014-11-16 DIAGNOSIS — R197 Diarrhea, unspecified: Secondary | ICD-10-CM | POA: Diagnosis not present

## 2014-11-16 LAB — COMPREHENSIVE METABOLIC PANEL
ALK PHOS: 44 U/L (ref 39–117)
ALT: 33 U/L (ref 0–35)
AST: 23 U/L (ref 0–37)
Albumin: 4.3 g/dL (ref 3.5–5.2)
BILIRUBIN TOTAL: 0.4 mg/dL (ref 0.2–1.2)
BUN: 10 mg/dL (ref 6–23)
CO2: 27 mEq/L (ref 19–32)
Calcium: 10.3 mg/dL (ref 8.4–10.5)
Chloride: 105 mEq/L (ref 96–112)
Creatinine, Ser: 0.65 mg/dL (ref 0.40–1.20)
GFR: 135.72 mL/min (ref >60.00)
Glucose, Bld: 102 mg/dL — ABNORMAL HIGH (ref 70–99)
Potassium: 4 mEq/L (ref 3.5–5.1)
Sodium: 137 mEq/L (ref 135–145)
Total Protein: 7 g/dL (ref 6.0–8.3)

## 2014-11-16 LAB — CBC WITH DIFFERENTIAL/PLATELET
Basophils Absolute: 0 10*3/uL (ref 0.0–0.1)
Basophils Relative: 0.7 % (ref 0.0–3.0)
EOS PCT: 1.6 % (ref 0.0–5.0)
Eosinophils Absolute: 0.1 10*3/uL (ref 0.0–0.7)
HCT: 40.4 % (ref 36.0–46.0)
Hemoglobin: 13 g/dL (ref 12.0–15.0)
LYMPHS ABS: 1.6 10*3/uL (ref 0.7–4.0)
Lymphocytes Relative: 32.8 % (ref 12.0–46.0)
MCHC: 32.1 g/dL (ref 30.0–36.0)
MCV: 72.3 fl — ABNORMAL LOW (ref 78.0–100.0)
Monocytes Absolute: 0.5 10*3/uL (ref 0.1–1.0)
Monocytes Relative: 10.5 % (ref 3.0–12.0)
Neutro Abs: 2.7 10*3/uL (ref 1.4–7.7)
Neutrophils Relative %: 54.4 % (ref 43.0–77.0)
Platelets: 210 10*3/uL (ref 150.0–400.0)
RBC: 5.59 Mil/uL — AB (ref 3.87–5.11)
RDW: 14.4 % (ref 11.5–15.5)
WBC: 4.9 10*3/uL (ref 4.0–10.5)

## 2014-11-16 LAB — SEDIMENTATION RATE: SED RATE: 7 mm/h (ref 0–22)

## 2014-11-18 ENCOUNTER — Other Ambulatory Visit: Payer: Medicaid Other

## 2014-11-18 DIAGNOSIS — R197 Diarrhea, unspecified: Secondary | ICD-10-CM

## 2014-11-19 LAB — GASTROINTESTINAL PATHOGEN PANEL PCR
C. DIFFICILE TOX A/B, PCR: NEGATIVE
Campylobacter, PCR: NEGATIVE
Cryptosporidium, PCR: NEGATIVE
E COLI (STEC) STX1/STX2, PCR: NEGATIVE
E coli (ETEC) LT/ST PCR: NEGATIVE
E coli 0157, PCR: NEGATIVE
GIARDIA LAMBLIA, PCR: NEGATIVE
NOROVIRUS, PCR: NEGATIVE
ROTAVIRUS, PCR: NEGATIVE
Salmonella, PCR: NEGATIVE
Shigella, PCR: NEGATIVE

## 2014-12-10 ENCOUNTER — Ambulatory Visit: Payer: Medicaid Other | Admitting: Skilled Nursing Facility1

## 2015-02-05 ENCOUNTER — Encounter (HOSPITAL_BASED_OUTPATIENT_CLINIC_OR_DEPARTMENT_OTHER): Payer: Self-pay | Admitting: *Deleted

## 2015-02-05 ENCOUNTER — Emergency Department (HOSPITAL_BASED_OUTPATIENT_CLINIC_OR_DEPARTMENT_OTHER)
Admission: EM | Admit: 2015-02-05 | Discharge: 2015-02-05 | Disposition: A | Discharge status: Home / Self Care | Payer: Medicaid Other | Attending: Emergency Medicine | Admitting: Emergency Medicine

## 2015-02-05 DIAGNOSIS — Z8659 Personal history of other mental and behavioral disorders: Secondary | ICD-10-CM | POA: Insufficient documentation

## 2015-02-05 DIAGNOSIS — M25571 Pain in right ankle and joints of right foot: Secondary | ICD-10-CM | POA: Diagnosis present

## 2015-02-05 DIAGNOSIS — Z87891 Personal history of nicotine dependence: Secondary | ICD-10-CM | POA: Insufficient documentation

## 2015-02-05 DIAGNOSIS — Z8742 Personal history of other diseases of the female genital tract: Secondary | ICD-10-CM | POA: Diagnosis not present

## 2015-02-05 MED ORDER — IBUPROFEN 800 MG PO TABS
800.0000 mg | ORAL_TABLET | Freq: Once | ORAL | Status: AC
  Administered 2015-02-05: 800 mg via ORAL
  Filled 2015-02-05: qty 1

## 2015-02-05 MED ORDER — IBUPROFEN 800 MG PO TABS: 800.0000 mg | ORAL_TABLET | Freq: Three times a day (TID) | ORAL | Status: DC

## 2015-02-05 NOTE — ED Notes (Signed)
Pt c/o right ankle pain x 2 weeks denies injury 

## 2015-02-05 NOTE — ED Provider Notes (Signed)
CSN: [id number]     Arrival date & time 02/05/15  1404 History  This chart was scribed for Eber Hong, MD by Doreatha Martin, ED Scribe. This patient was seen in room MH06/MH06 and the patient's care was started at 2:57 PM.     Chief Complaint  Patient presents with  . Ankle Pain   The history is provided by the patient. No language interpreter was used.   HPI Comments: Christina Greene is a 34 y.o. female who presents to the Emergency Department complaining of moderate radiating pain to the right ankle onset 2 weeks ago with associated swelling. She states that pain is worsened with ambulation.  She notes that she sprained the ankle a few years ago, and does not recall any new injuries. Pt states that she is on her feet all day at work. No Hx of gout, recent surgery. She has tried Tylenol, Ibuprofen and Goody powders with mild relief. She also notes mild relief with a Velcro ankle brace that she purchased. She denies fever.   Past Medical History  Diagnosis Date  . Ectopic pregnancy   . Ovarian cyst   . Abnormal Pap smear     colpo ok  . Depression     doing ok   Past Surgical History  Procedure Laterality Date  . Dilation and curettage of uterus    . Laparotomy      ruptured ectopic, rt tube; ovarian cyst  . Wisdom tooth extraction    . Nasal fracture surgery  2002  . Hand surgery Right   . Laparoscopic appendectomy N/A 10/04/2013    Procedure: APPENDECTOMY LAPAROSCOPIC;  Surgeon: Adolph Pollack, MD;  Location: [facility];  Service: General;  Laterality: N/A;   Family History  Problem Relation Age of Onset  . Hypertension Mother   . Hypothyroidism Mother   . Diabetes Father   . Liver cancer Father   . Hepatitis Father   . Other Neg Hx    Social History  Substance Use Topics  . Smoking status: Former Smoker -- 4 years    Types: Cigars  . Smokeless tobacco: Never Used     Comment: black and milds  . Alcohol Use: 0.0 oz/week    0 Standard drinks or equivalent per week   Comment: occ   OB History    Gravida Para Term Preterm AB TAB SAB Ectopic Multiple Living   0 3 0 2 1 0 1     Review of Systems  Constitutional: Negative for fever.  Musculoskeletal: Positive for arthralgias.   Allergies  Review of patient's allergies indicates no known allergies.  Home Medications   Prior to Admission medications   Medication Sig Start Date End Date Taking? Authorizing Provider  ibuprofen (ADVIL,MOTRIN) 800 MG tablet Take 1 tablet (800 mg total) by mouth 3 (three) times daily. 02/05/15   Eber Hong, MD  levonorgestrel-ethinyl estradiol Geanie Logan) tablet Take 1 tablet by mouth daily. Patient not taking: Reported on 11/03/2014 08/09/14   Brock Bad, MD   BP 122/74 mmHg  Pulse 79  Temp(Src) 98.2 F (36.8 C) (Oral)  Resp 16  Ht  (1.676 m)  Wt 180 lb (81.647 kg)  BMI 29.07 kg/m2  SpO2 100%  LMP 02/05/2015 Physical Exam  Constitutional: She appears well-developed and well-nourished.  HENT:  Head: Normocephalic and atraumatic.  Eyes: Conjunctivae are normal. Right eye exhibits no discharge. Left eye exhibits no discharge.  Pulmonary/Chest: Effort normal. No respiratory distress.  Musculoskeletal:  Rt ankle mild pain with inversion. No pain with flexion or extension. No bony tenderness of the ankle or foot.   Neurological: She is alert. Coordination normal.  Normal motor function and sensation to the rt foot   Skin: Skin is warm and dry. No rash noted. She is not diaphoretic. No erythema.  Psychiatric: She has a normal mood and affect.  Nursing note and vitals reviewed.  ED Course  Procedures (including critical care time) DIAGNOSTIC STUDIES: Oxygen Saturation is 100% on RA, normal by my interpretation.    COORDINATION OF CARE: 3:01 PM Discussed treatment plan with pt at bedside and pt agreed to plan.   Labs Review Labs Reviewed - No data to display  Imaging Review No results found.   MDM   Final diagnoses:  Ankle pain,  right   Chronic ankle pain - no bony ttp, no swellign, no redness, no fevers - VS normal, no indication for imaging.  ASO, RICE, pt in agreement - referral to Dr. Pearletha Forge with [facility].  Meds given in ED:  Medications  ibuprofen (ADVIL,MOTRIN) tablet 800 mg (800 mg Oral Given 02/05/15 1531)    Discharge Medication List as of 02/05/2015  3:21 PM    START taking these medications   Details  ibuprofen (ADVIL,MOTRIN) 800 MG tablet Take 1 tablet (800 mg total) by mouth 3 (three) times daily., Starting 02/05/2015, Until Discontinued, Print        I personally performed the services described in this documentation, which was scribed in my presence. The recorded information has been reviewed and is accurate.    Eber Hong, MD 02/05/15 [phone]

## 2015-02-05 NOTE — Discharge Instructions (Signed)

## 2015-04-13 ENCOUNTER — Telehealth: Payer: Self-pay | Admitting: *Deleted

## 2015-04-13 DIAGNOSIS — N760 Acute vaginitis: Principal | ICD-10-CM

## 2015-04-13 DIAGNOSIS — B9689 Other specified bacterial agents as the cause of diseases classified elsewhere: Secondary | ICD-10-CM

## 2015-04-13 MED ORDER — METRONIDAZOLE 500 MG PO TABS: 500.0000 mg | ORAL_TABLET | Freq: Two times a day (BID) | ORAL | Status: DC

## 2015-04-13 NOTE — Telephone Encounter (Signed)
Patient is having BV symptoms- discharge with odor and is requesting oral medication. 3:19 Spoke to patient - advised would send Rx- but if no improvement- call for appointment.

## 2015-04-18 ENCOUNTER — Telehealth: Payer: Self-pay | Admitting: *Deleted

## 2015-04-18 NOTE — Telephone Encounter (Signed)
Patient is requesting a referral to an eye doctor. 11:53 LM on VM to CB

## 2015-05-20 ENCOUNTER — Other Ambulatory Visit: Payer: Self-pay | Admitting: *Deleted

## 2015-05-20 DIAGNOSIS — Z789 Other specified health status: Secondary | ICD-10-CM

## 2015-05-20 MED ORDER — LEVONORGESTREL 1.5 MG PO TABS: 1.5000 mg | ORAL_TABLET | Freq: Once | ORAL | Status: DC

## 2015-05-20 NOTE — Progress Notes (Addendum)
Pt called to office for Plan B pill.   Call placed to pt.  Pt states that she had intercourse on Wednesday and needs a Plan B pill.  Pt states that she had previously been on OCP but she has not been taking it correctly. Per nursing protocol Plan B protocol was reviewed.  Pt was advised that she will need to take home pregnancy test in order to have a negative test.  Pt was then advised to take product as directed.  Pt was advised to follow up in office if she would like to change BC.  Pt advised that she will need to get herself started on birth control in order to avoid process again. Pt states understanding.  Rx sent to pharmacy per protocol.

## 2015-05-30 IMAGING — CT CT ABD-PELV W/ CM
2 of 4 series · 16 of 46 positions shown, 18 images · IV contrast (APPLIED)
Comparison: None.

CLINICAL DATA: Right lower quadrant pain.

EXAM:
CT ABDOMEN AND PELVIS WITH CONTRAST
TECHNIQUE: Multidetector CT imaging of the abdomen and pelvis was performed
using the standard protocol following bolus administration of
intravenous contrast.
CONTRAST:  50mL OMNIPAQUE IOHEXOL 300 MG/ML SOLN, 100mL OMNIPAQUE
IOHEXOL 300 MG/ML SOLN

[Series 3: abd/pelvis 5.0 b31f · axial · 0.80mm/px · z∈[-418,+22]mm · 13 of 97 slices shown, 15 images]
[im 5/97  soft-tissue]
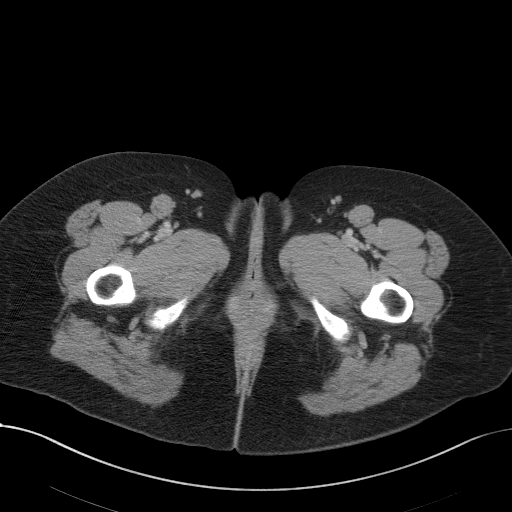
[im 5/97  bone]
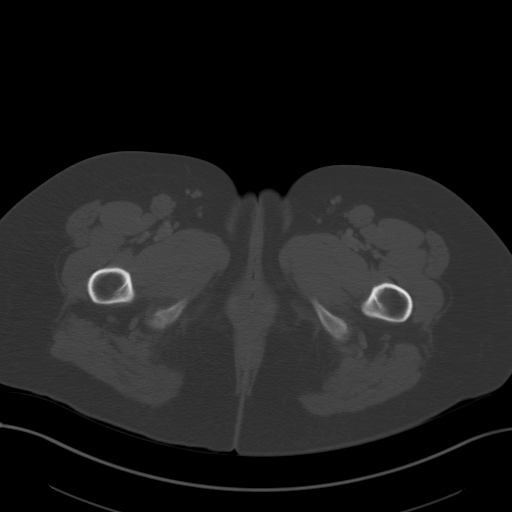
[im 13/97  soft-tissue]
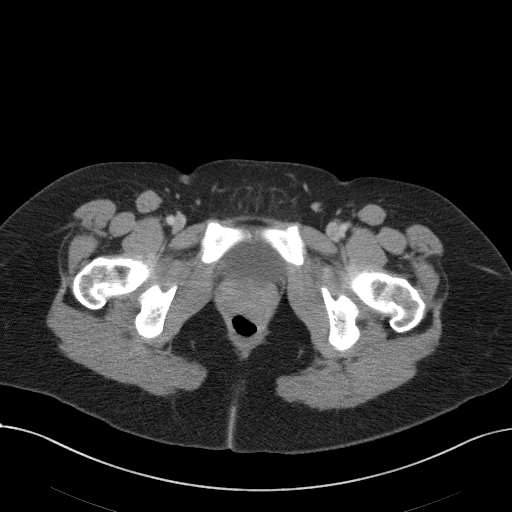
[im 21/97  soft-tissue]
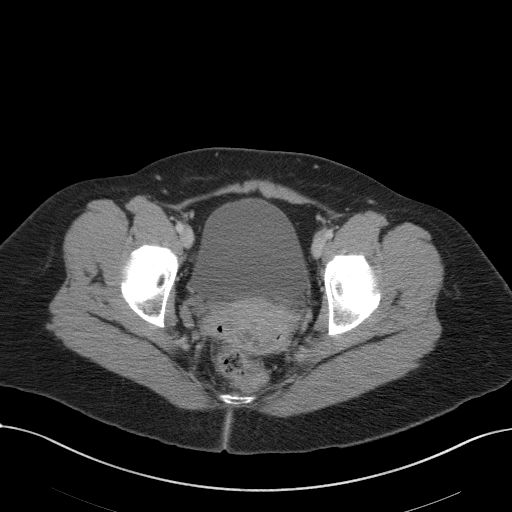
[im 29/97  soft-tissue]
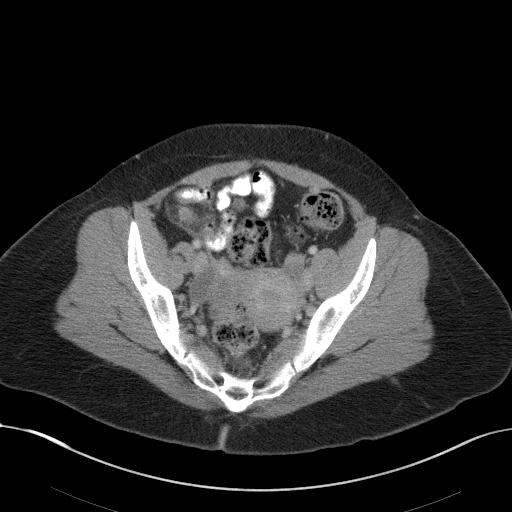
[im 33/97  soft-tissue]
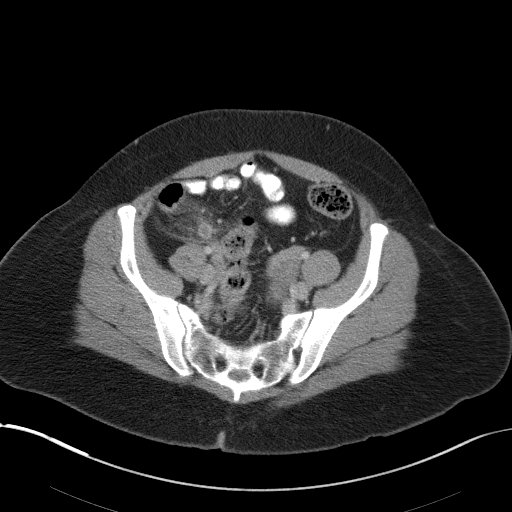
[im 41/97  soft-tissue]
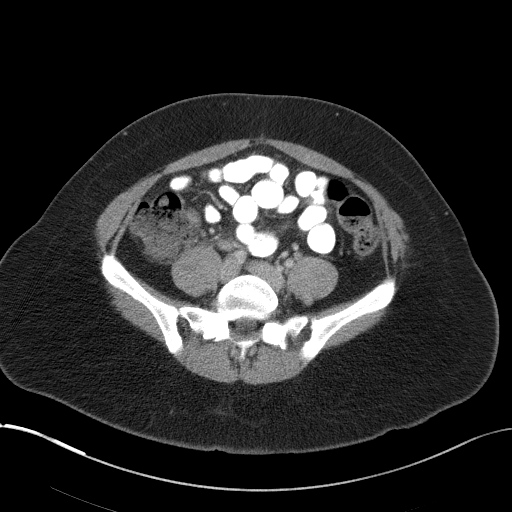
[im 49/97  soft-tissue]
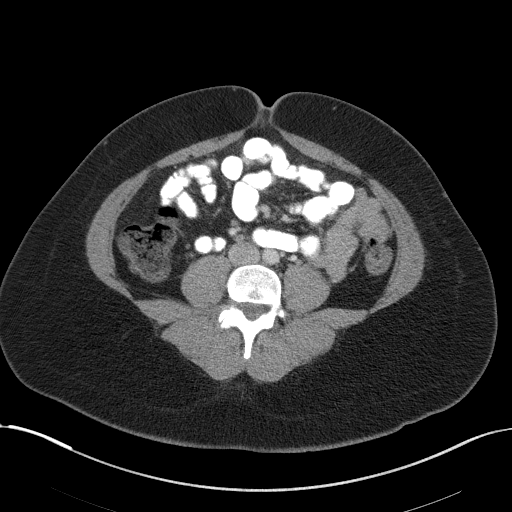
[im 57/97  soft-tissue]
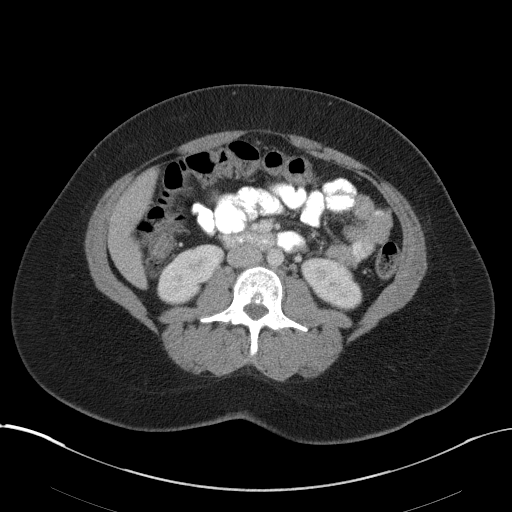
[im 65/97  soft-tissue]
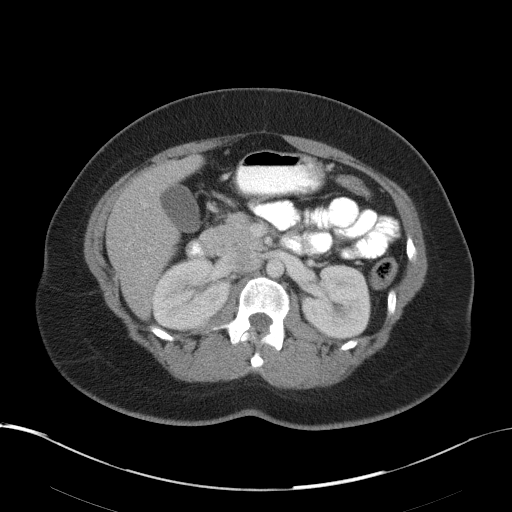
[im 65/97  bone]
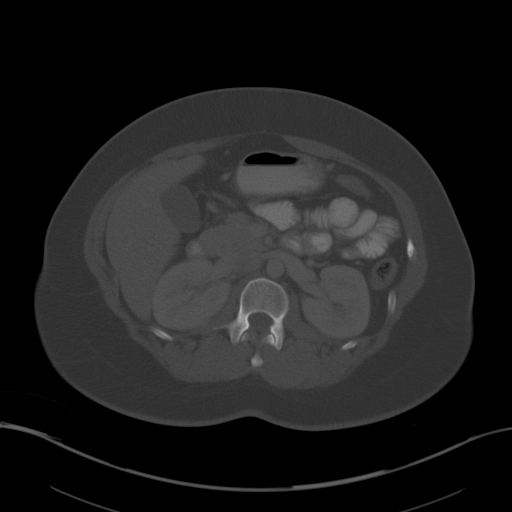
[im 69/97  soft-tissue]
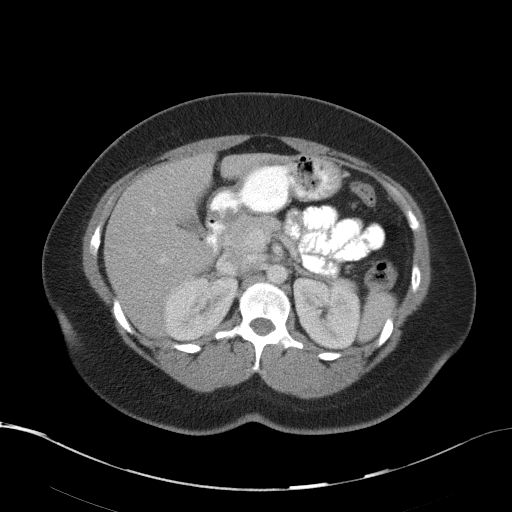
[im 77/97  soft-tissue]
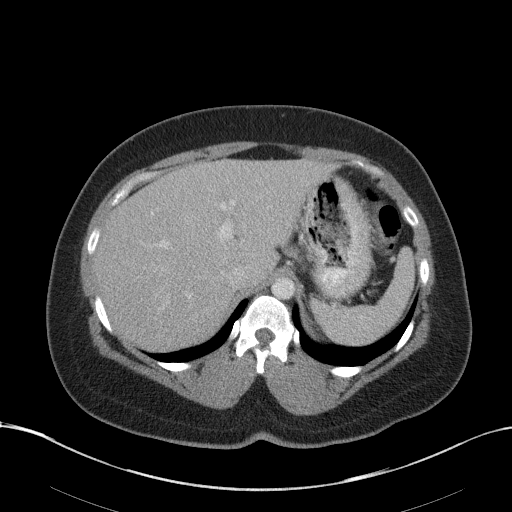
[im 85/97  soft-tissue]
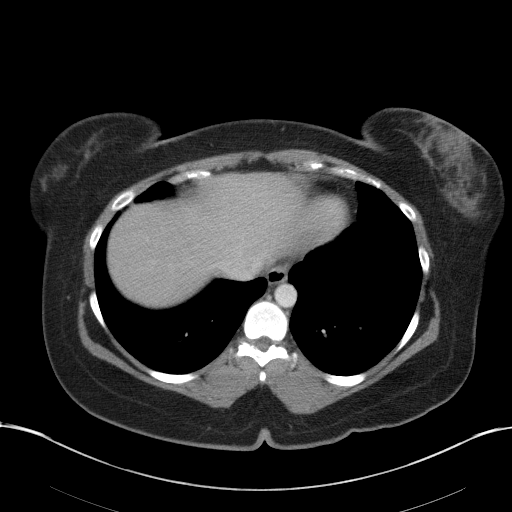
[im 93/97  soft-tissue]
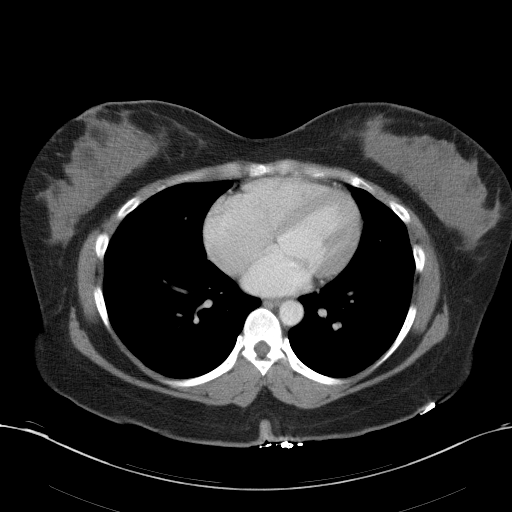

[Series 6: abd/pelvis 3.0 coronal · coronal · 0.93mm/px · 3 of 88 slices shown]
[im 30/88  soft-tissue]
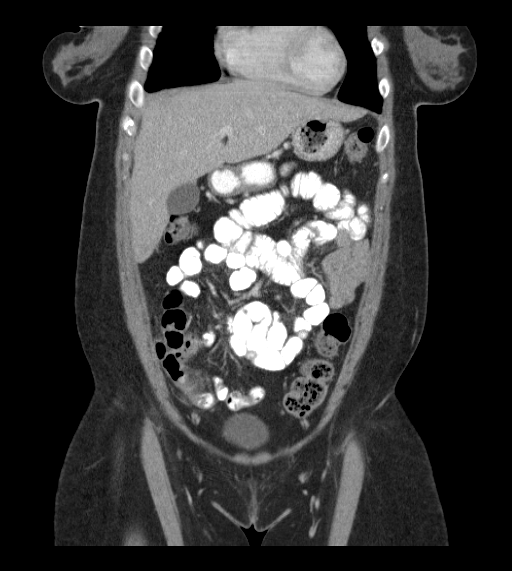
[im 39/88  soft-tissue]
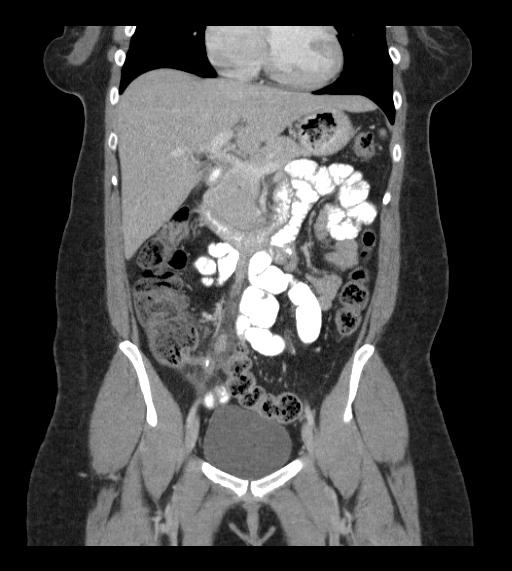
[im 49/88  soft-tissue]
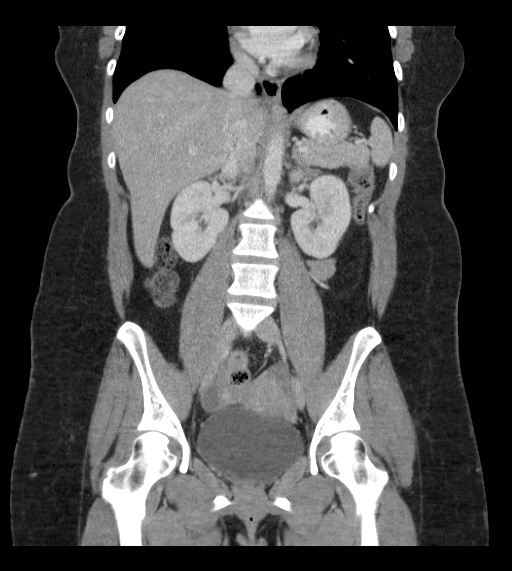

[16 of 46 positions shown; findings below may reference images not displayed]

FINDINGS: The lung bases are clear. No pleural effusion or pulmonary nodule.
The heart is normal in size. No pericardial effusion

The liver is unremarkable. No focal hepatic lesions or intrahepatic
biliary dilatation. Gallbladder is normal. No common bile duct
dilatation. The pancreas is normal. The spleen is normal. The
adrenal glands kidneys are normal. No renal or obstructing ureteral
calculi. No findings for pyelonephritis.

The stomach, duodenum, small bowel and colon are unremarkable. No
inflammatory changes, mass lesions or obstructive findings.

The appendix is distended and inflamed with wall thickening and
enhancement. This also para appendiceal inflammatory changes. A
small appendicolith is noted. No findings for rupture.

No mesenteric or retroperitoneal mass or adenopathy. Pericecal
inflamed/hyperplastic lymph nodes are noted.

The uterus and ovaries are unremarkable. There is a simple appearing
cyst associated with the right ovary. The bladder is normal. No
pelvic mass or abscess. No free pelvic fluid collections. No
inguinal mass or adenopathy.

The bony structures are unremarkable.
IMPRESSION: CT findings consistent with acute appendicitis. No findings for
rupture or abscess. There is an obstructing appendicolith
proximally.

These results were called by telephone at the time of interpretation
on 10/04/2013 at [DATE] to Dr. BRATCHER, BARIS , who verbally
acknowledged these results.

## 2015-06-24 ENCOUNTER — Ambulatory Visit (INDEPENDENT_AMBULATORY_CARE_PROVIDER_SITE_OTHER): Payer: Medicaid Other | Admitting: Certified Nurse Midwife

## 2015-06-24 VITALS — BP 133/93 | HR 84 | Wt 182.0 lb

## 2015-06-24 DIAGNOSIS — Z3009 Encounter for other general counseling and advice on contraception: Secondary | ICD-10-CM | POA: Diagnosis not present

## 2015-06-24 DIAGNOSIS — Z30018 Encounter for initial prescription of other contraceptives: Secondary | ICD-10-CM

## 2015-06-24 MED ORDER — ETONOGESTREL-ETHINYL ESTRADIOL 0.12-0.015 MG/24HR VA RING: VAGINAL_RING | VAGINAL | Status: DC

## 2015-06-24 NOTE — Progress Notes (Signed)
Patient ID: Christina Greene, female   DOB: 1981/06/03, 35 y.o.   MRN: [id number]  Chief Complaint  Patient presents with  . Advice Only    discuss BC options    HPI Christina Greene is a 35 y.o. female.  Is having issues with vaginal dryness during sexual intercourse and thinks that it is her pills, and has had this problem with other OCPs in the past.   Various birth control options discussed.  Patient is willing to try Nuva Ring.  Discussed how to use Nuva Ring.  Patient verbalized understanding.  Currently on her period.   HPI  Past Medical History  Diagnosis Date  . Ectopic pregnancy   . Ovarian cyst   . Abnormal Pap smear     colpo ok  . Depression     doing ok    Past Surgical History  Procedure Laterality Date  . Dilation and curettage of uterus    . Laparotomy      ruptured ectopic, rt tube; ovarian cyst  . Wisdom tooth extraction    . Nasal fracture surgery  2002  . Hand surgery Right   . Laparoscopic appendectomy N/A 10/04/2013    Procedure: APPENDECTOMY LAPAROSCOPIC;  Surgeon: Adolph Pollack, MD;  Location: [facility];  Service: General;  Laterality: N/A;    Family History  Problem Relation Age of Onset  . Hypertension Mother   . Hypothyroidism Mother   . Diabetes Father   . Liver cancer Father   . Hepatitis Father   . Other Neg Hx     Social History Social History  Substance Use Topics  . Smoking status: Former Smoker -- 4 years    Types: Cigars  . Smokeless tobacco: Never Used     Comment: black and milds  . Alcohol Use: 0.0 oz/week    0 Standard drinks or equivalent per week     Comment: occ    No Known Allergies  Current Outpatient Prescriptions  Medication Sig Dispense Refill  . levonorgestrel-ethinyl estradiol (ENPRESSE,TRIVORA) tablet Take 1 tablet by mouth daily. 1 Package 11  . etonogestrel-ethinyl estradiol (NUVARING) 0.12-0.015 MG/24HR vaginal ring Insert vaginally and leave in place for 3 consecutive weeks, then remove for 1 week. 1  each 12  . ibuprofen (ADVIL,MOTRIN) 800 MG tablet Take 1 tablet (800 mg total) by mouth 3 (three) times daily. 21 tablet 0   No current facility-administered medications for this visit.    Review of Systems Review of Systems Constitutional: negative for fatigue and weight loss Respiratory: negative for cough and wheezing Cardiovascular: negative for chest pain, fatigue and palpitations Gastrointestinal: negative for abdominal pain and change in bowel habits Genitourinary:negative Integument/breast: negative for nipple discharge Musculoskeletal:negative for myalgias Neurological: negative for gait problems and tremors Behavioral/Psych: negative for abusive relationship, depression Endocrine: negative for temperature intolerance     Blood pressure 133/93, pulse 84, weight 182 lb (82.555 kg), last menstrual period 06/22/2015.  Physical Exam Physical Exam: Deferred General:   alert    100% of 15 min visit spent on counseling and coordination of care.   Data Reviewed Previous medical hx, meds, labs  Assessment     Contraception management: Starting Nuva Ring: no contraindications  Discontinuing OCPs    Plan    No orders of the defined types were placed in this encounter.   Meds ordered this encounter  Medications  . etonogestrel-ethinyl estradiol (NUVARING) 0.12-0.015 MG/24HR vaginal ring    Sig: Insert vaginally and leave in place for 3  consecutive weeks, then remove for 1 week.    Dispense:  1 each    Refill:  12     Possible management options include: another form of contraception Follow up as needed.

## 2015-06-25 ENCOUNTER — Encounter: Payer: Self-pay | Admitting: Certified Nurse Midwife

## 2015-06-25 DIAGNOSIS — Z309 Encounter for contraceptive management, unspecified: Secondary | ICD-10-CM | POA: Insufficient documentation

## 2015-06-27 NOTE — Telephone Encounter (Signed)
Patient has been in the office seen call.

## 2015-07-19 ENCOUNTER — Emergency Department (HOSPITAL_COMMUNITY)
Admission: EM | Admit: 2015-07-19 | Discharge: 2015-07-19 | Disposition: A | Discharge status: Home / Self Care | Payer: Medicaid Other | Source: Home / Self Care | Attending: Family Medicine | Admitting: Family Medicine

## 2015-07-19 ENCOUNTER — Emergency Department (INDEPENDENT_AMBULATORY_CARE_PROVIDER_SITE_OTHER): Payer: Medicaid Other

## 2015-07-19 ENCOUNTER — Encounter (HOSPITAL_COMMUNITY): Payer: Self-pay | Admitting: *Deleted

## 2015-07-19 DIAGNOSIS — J069 Acute upper respiratory infection, unspecified: Secondary | ICD-10-CM | POA: Diagnosis not present

## 2015-07-19 MED ORDER — IPRATROPIUM BROMIDE 0.06 % NA SOLN: 2.0000 | Freq: Four times a day (QID) | NASAL | Status: DC

## 2015-07-19 MED ORDER — GUAIFENESIN-CODEINE 100-10 MG/5ML PO SYRP: 10.0000 mL | ORAL_SOLUTION | Freq: Four times a day (QID) | ORAL | Status: DC | PRN

## 2015-07-19 NOTE — ED Notes (Signed)
Pt  Reports  Symptoms  Of  Cough  /  Congested       Coughing         Symptoms   X  4  Days

## 2015-07-19 NOTE — Discharge Instructions (Signed)
Drink plenty of fluids as discussed, use medicine as prescribed, and mucinex or delsym for cough. Return or see your doctor if further problems °

## 2015-07-19 NOTE — ED Provider Notes (Signed)
CSN: [id number]     Arrival date & time 07/19/15  1316 History   First MD Initiated Contact with Patient 07/19/15 1420     Chief Complaint  Patient presents with  . URI   (Consider location/radiation/quality/duration/timing/severity/associated sxs/prior Treatment) Patient is a 35 y.o. female presenting with URI. The history is provided by the patient.  URI Presenting symptoms: congestion, cough, rhinorrhea and sore throat   Presenting symptoms: no fever   Severity:  Moderate Onset quality:  Gradual Duration:  3 days Progression:  Unchanged Chronicity:  New Relieved by:  None tried Worsened by:  Nothing tried Ineffective treatments:  None tried Associated symptoms: no myalgias and no wheezing     Past Medical History  Diagnosis Date  . Ectopic pregnancy   . Ovarian cyst   . Abnormal Pap smear     colpo ok  . Depression     doing ok   Past Surgical History  Procedure Laterality Date  . Dilation and curettage of uterus    . Laparotomy      ruptured ectopic, rt tube; ovarian cyst  . Wisdom tooth extraction    . Nasal fracture surgery  2002  . Hand surgery Right   . Laparoscopic appendectomy N/A 10/04/2013    Procedure: APPENDECTOMY LAPAROSCOPIC;  Surgeon: Adolph Pollack, MD;  Location: [facility];  Service: General;  Laterality: N/A;   Family History  Problem Relation Age of Onset  . Hypertension Mother   . Hypothyroidism Mother   . Diabetes Father   . Liver cancer Father   . Hepatitis Father   . Other Neg Hx    Social History  Substance Use Topics  . Smoking status: Former Smoker -- 4 years    Types: Cigars  . Smokeless tobacco: Never Used     Comment: black and milds  . Alcohol Use: 0.0 oz/week    0 Standard drinks or equivalent per week     Comment: occ   OB History    Gravida Para Term Preterm AB TAB SAB Ectopic Multiple Living   0 3 0 2 1 0 1     Review of Systems  Constitutional: Negative.  Negative for fever.  HENT: Positive for congestion,  postnasal drip, rhinorrhea and sore throat.   Respiratory: Positive for cough. Negative for wheezing.   Cardiovascular: Negative.   Gastrointestinal: Negative.   Musculoskeletal: Negative for myalgias.  All other systems reviewed and are negative.   Allergies  Review of patient's allergies indicates no known allergies.  Home Medications   Prior to Admission medications   Medication Sig Start Date End Date Taking? Authorizing Provider  etonogestrel-ethinyl estradiol (NUVARING) 0.12-0.015 MG/24HR vaginal ring Insert vaginally and leave in place for 3 consecutive weeks, then remove for 1 week. 06/24/15   Rachelle A Denney, CNM  guaiFENesin-codeine (ROBITUSSIN AC) 100-10 MG/5ML syrup Take 10 mLs by mouth 4 (four) times daily as needed for cough. 07/19/15   Linna Hoff, MD  ibuprofen (ADVIL,MOTRIN) 800 MG tablet Take 1 tablet (800 mg total) by mouth 3 (three) times daily. 02/05/15   Eber Hong, MD  ipratropium (ATROVENT) 0.06 % nasal spray Place 2 sprays into both nostrils 4 (four) times daily. 07/19/15   Linna Hoff, MD  levonorgestrel-ethinyl estradiol Geanie Logan) tablet Take 1 tablet by mouth daily. 08/09/14   Brock Bad, MD   Meds Ordered and Administered this Visit  Medications - No data to display  BP 133/94 mmHg  Pulse 116  Temp(Src) 99.7 F (37.6 C) (Oral)  Resp 16  SpO2 97%  LMP 06/22/2015 No data found.   Physical Exam  Constitutional: She is oriented to person, place, and time. She appears well-developed and well-nourished. No distress.  HENT:  Head: Normocephalic.  Right Ear: External ear normal.  Left Ear: External ear normal.  Mouth/Throat: Oropharynx is clear and moist.  Neck: Normal range of motion. Neck supple.  Pulmonary/Chest: Effort normal and breath sounds normal.  Lymphadenopathy:    She has no cervical adenopathy.  Neurological: She is alert and oriented to person, place, and time.  Skin: Skin is warm and dry.  Nursing note and vitals  reviewed.   ED Course  Procedures (including critical care time)  Labs Review Labs Reviewed - No data to display  Imaging Review Dg Chest 2 View  07/19/2015  CLINICAL DATA:  Cough and shortness of breath for 3 days EXAM: CHEST  2 VIEW COMPARISON:  January 24, 2010 FINDINGS: Lungs are clear. Heart size and pulmonary vascularity are normal. No adenopathy. No bone lesions. IMPRESSION: No edema or consolidation. Electronically Signed   By: Bretta Bang III M.D.   On: 07/19/2015 14:50   X-rays reviewed and report per radiologist.   Visual Acuity Review  Right Eye Distance:   Left Eye Distance:   Bilateral Distance:    Right Eye Near:   Left Eye Near:    Bilateral Near:         MDM   1. URI (upper respiratory infection)        Linna Hoff, MD 07/19/15 [phone]

## 2015-07-25 ENCOUNTER — Encounter (HOSPITAL_BASED_OUTPATIENT_CLINIC_OR_DEPARTMENT_OTHER): Payer: Self-pay | Admitting: Emergency Medicine

## 2015-07-25 ENCOUNTER — Emergency Department (HOSPITAL_BASED_OUTPATIENT_CLINIC_OR_DEPARTMENT_OTHER)
Admission: EM | Admit: 2015-07-25 | Discharge: 2015-07-25 | Disposition: A | Discharge status: Home / Self Care | Payer: Medicaid Other | Attending: Emergency Medicine | Admitting: Emergency Medicine

## 2015-07-25 DIAGNOSIS — R1111 Vomiting without nausea: Secondary | ICD-10-CM

## 2015-07-25 DIAGNOSIS — Z791 Long term (current) use of non-steroidal anti-inflammatories (NSAID): Secondary | ICD-10-CM | POA: Diagnosis not present

## 2015-07-25 DIAGNOSIS — Z3202 Encounter for pregnancy test, result negative: Secondary | ICD-10-CM | POA: Diagnosis not present

## 2015-07-25 DIAGNOSIS — Z793 Long term (current) use of hormonal contraceptives: Secondary | ICD-10-CM | POA: Diagnosis not present

## 2015-07-25 DIAGNOSIS — Z8742 Personal history of other diseases of the female genital tract: Secondary | ICD-10-CM | POA: Diagnosis not present

## 2015-07-25 DIAGNOSIS — Z87891 Personal history of nicotine dependence: Secondary | ICD-10-CM | POA: Insufficient documentation

## 2015-07-25 DIAGNOSIS — Z79899 Other long term (current) drug therapy: Secondary | ICD-10-CM | POA: Diagnosis not present

## 2015-07-25 DIAGNOSIS — R112 Nausea with vomiting, unspecified: Secondary | ICD-10-CM | POA: Diagnosis present

## 2015-07-25 DIAGNOSIS — F419 Anxiety disorder, unspecified: Secondary | ICD-10-CM | POA: Insufficient documentation

## 2015-07-25 LAB — COMPREHENSIVE METABOLIC PANEL
ALK PHOS: 54 U/L (ref 38–126)
ALT: 69 U/L — ABNORMAL HIGH (ref 14–54)
ANION GAP: 11 (ref 5–15)
AST: 51 U/L — ABNORMAL HIGH (ref 15–41)
Albumin: 4.5 g/dL (ref 3.5–5.0)
BUN: 14 mg/dL (ref 6–20)
CALCIUM: 9.6 mg/dL (ref 8.9–10.3)
CHLORIDE: 100 mmol/L — AB (ref 101–111)
CO2: 30 mmol/L (ref 22–32)
Creatinine, Ser: 0.65 mg/dL (ref 0.44–1.00)
GFR calc non Af Amer: >60 mL/min (ref >60)
Glucose, Bld: 122 mg/dL — ABNORMAL HIGH (ref 65–99)
POTASSIUM: 3.7 mmol/L (ref 3.5–5.1)
SODIUM: 141 mmol/L (ref 135–145)
Total Bilirubin: 0.5 mg/dL (ref 0.3–1.2)
Total Protein: 7.8 g/dL (ref 6.5–8.1)

## 2015-07-25 LAB — RAPID URINE DRUG SCREEN, HOSP PERFORMED
AMPHETAMINES: NOT DETECTED
Barbiturates: NOT DETECTED
Benzodiazepines: NOT DETECTED
COCAINE: NOT DETECTED
OPIATES: NOT DETECTED
TETRAHYDROCANNABINOL: NOT DETECTED

## 2015-07-25 LAB — CBC
HCT: 40.9 % (ref 36.0–46.0)
HEMOGLOBIN: 13.4 g/dL (ref 12.0–15.0)
MCH: 22.2 pg — AB (ref 26.0–34.0)
MCHC: 32.8 g/dL (ref 30.0–36.0)
MCV: 67.8 fL — ABNORMAL LOW (ref 78.0–100.0)
Platelets: 260 10*3/uL (ref 150–400)
RBC: 6.03 MIL/uL — AB (ref 3.87–5.11)
RDW: 13.2 % (ref 11.5–15.5)
WBC: 6.3 10*3/uL (ref 4.0–10.5)

## 2015-07-25 LAB — LIPASE, BLOOD: LIPASE: 25 U/L (ref 11–51)

## 2015-07-25 LAB — URINALYSIS, ROUTINE W REFLEX MICROSCOPIC
Bilirubin Urine: NEGATIVE
Glucose, UA: NEGATIVE mg/dL
HGB URINE DIPSTICK: NEGATIVE
Ketones, ur: NEGATIVE mg/dL
Leukocytes, UA: NEGATIVE
Nitrite: NEGATIVE
Protein, ur: 100 mg/dL — AB
SPECIFIC GRAVITY, URINE: 1.026 (ref 1.005–1.030)
pH: 8.5 — ABNORMAL HIGH (ref 5.0–8.0)

## 2015-07-25 LAB — URINE MICROSCOPIC-ADD ON

## 2015-07-25 LAB — PREGNANCY, URINE: Preg Test, Ur: NEGATIVE

## 2015-07-25 MED ORDER — ONDANSETRON 8 MG PO TBDP: ORAL_TABLET | ORAL | Status: DC

## 2015-07-25 MED ORDER — ONDANSETRON HCL 4 MG/2ML IJ SOLN
4.0000 mg | Freq: Once | INTRAMUSCULAR | Status: AC
  Administered 2015-07-25: 4 mg via INTRAVENOUS
  Filled 2015-07-25: qty 2

## 2015-07-25 MED ORDER — SODIUM CHLORIDE 0.9 % IV BOLUS (SEPSIS)
1000.0000 mL | Freq: Once | INTRAVENOUS | Status: AC
  Administered 2015-07-25: 1000 mL via INTRAVENOUS

## 2015-07-25 MED ORDER — KETOROLAC TROMETHAMINE 30 MG/ML IJ SOLN
30.0000 mg | Freq: Once | INTRAMUSCULAR | Status: AC
  Administered 2015-07-25: 30 mg via INTRAVENOUS
  Filled 2015-07-25: qty 1

## 2015-07-25 NOTE — Discharge Instructions (Signed)

## 2015-07-25 NOTE — ED Notes (Addendum)
C/o vomiting since 4pm on Sunday. Denies any diarrhea or fevers. States she has vomited 6 times. Denies any abd pain or urinary symptoms. Has not taken any meds pta. States hands are cramping and she is not able to relax both hands bilateral. States she feelings tingling all over. Encouraged to take slow deep breaths.

## 2015-07-25 NOTE — ED Provider Notes (Signed)
CSN: [id number]     Arrival date & time 07/25/15  0230 History   First MD Initiated Contact with Patient 07/25/15 0300     Chief Complaint  Patient presents with  . Emesis     (Consider location/radiation/quality/duration/timing/severity/associated sxs/prior Treatment) Patient is a 35 y.o. female presenting with vomiting. The history is provided by the patient.  Emesis Severity:  Moderate Timing:  Intermittent Quality:  Stomach contents Progression:  Unchanged Chronicity:  New Recent urination:  Normal Context: not post-tussive   Relieved by:  Nothing Worsened by:  Nothing tried Ineffective treatments:  None tried Associated symptoms: no abdominal pain and no diarrhea   Risk factors: no alcohol use and no diabetes     Past Medical History  Diagnosis Date  . Ectopic pregnancy   . Ovarian cyst   . Abnormal Pap smear     colpo ok  . Depression     doing ok   Past Surgical History  Procedure Laterality Date  . Dilation and curettage of uterus    . Laparotomy      ruptured ectopic, rt tube; ovarian cyst  . Wisdom tooth extraction    . Nasal fracture surgery  2002  . Hand surgery Right   . Laparoscopic appendectomy N/A 10/04/2013    Procedure: APPENDECTOMY LAPAROSCOPIC;  Surgeon: Adolph Pollack, MD;  Location: [facility];  Service: General;  Laterality: N/A;   Family History  Problem Relation Age of Onset  . Hypertension Mother   . Hypothyroidism Mother   . Diabetes Father   . Liver cancer Father   . Hepatitis Father   . Other Neg Hx    Social History  Substance Use Topics  . Smoking status: Former Smoker -- 4 years    Types: Cigars  . Smokeless tobacco: Never Used     Comment: black and milds  . Alcohol Use: 0.0 oz/week    0 Standard drinks or equivalent per week     Comment: occ   OB History    Gravida Para Term Preterm AB TAB SAB Ectopic Multiple Living   0 3 0 2 1 0 1     Review of Systems  Constitutional: Negative for fever.  HENT: Negative  for drooling.   Respiratory: Negative for cough and shortness of breath.   Cardiovascular: Negative for chest pain, palpitations and leg swelling.  Gastrointestinal: Positive for nausea and vomiting. Negative for abdominal pain, diarrhea and constipation.  Genitourinary: Negative for dysuria.  All other systems reviewed and are negative.     Allergies  Review of patient's allergies indicates no known allergies.  Home Medications   Prior to Admission medications   Medication Sig Start Date End Date Taking? Authorizing Provider  etonogestrel-ethinyl estradiol (NUVARING) 0.12-0.015 MG/24HR vaginal ring Insert vaginally and leave in place for 3 consecutive weeks, then remove for 1 week. 06/24/15   Rachelle A Denney, CNM  guaiFENesin-codeine (ROBITUSSIN AC) 100-10 MG/5ML syrup Take 10 mLs by mouth 4 (four) times daily as needed for cough. 07/19/15   Linna Hoff, MD  ibuprofen (ADVIL,MOTRIN) 800 MG tablet Take 1 tablet (800 mg total) by mouth 3 (three) times daily. 02/05/15   Eber Hong, MD  ipratropium (ATROVENT) 0.06 % nasal spray Place 2 sprays into both nostrils 4 (four) times daily. 07/19/15   Linna Hoff, MD  levonorgestrel-ethinyl estradiol Geanie Logan) tablet Take 1 tablet by mouth daily. 08/09/14   Brock Bad, MD   BP 136/89 mmHg  Pulse 92  Temp(Src) 99 F (37.2 C)  Resp 18  SpO2 99%  LMP 06/22/2015 (Approximate) Physical Exam  Constitutional: She is oriented to person, place, and time. She appears well-developed and well-nourished. No distress.  HENT:  Head: Normocephalic and atraumatic.  Mouth/Throat: Oropharynx is clear and moist.  Eyes: Conjunctivae are normal. Pupils are equal, round, and reactive to light.  Neck: Normal range of motion. Neck supple.  Cardiovascular: Normal rate, regular rhythm and intact distal pulses.   Pulmonary/Chest: Effort normal and breath sounds normal. Tachypnea noted. She has no wheezes. She has no rales.  Abdominal: Soft. Bowel  sounds are normal.  Musculoskeletal: Normal range of motion.  Neurological: She is alert and oriented to person, place, and time. She has normal reflexes. No cranial nerve deficit.  Skin: Skin is warm and dry.  Psychiatric: Her mood appears anxious.    ED Course  Procedures (including critical care time) Labs Review Labs Reviewed  COMPREHENSIVE METABOLIC PANEL - Abnormal; Notable for the following:    Chloride 100 (*)    Glucose, Bld 122 (*)    AST 51 (*)    ALT 69 (*)    All other components within normal limits  CBC - Abnormal; Notable for the following:    RBC 6.03 (*)    MCV 67.8 (*)    MCH 22.2 (*)    All other components within normal limits  URINALYSIS, ROUTINE W REFLEX MICROSCOPIC (NOT AT [facility]) - Abnormal; Notable for the following:    pH 8.5 (*)    Protein, ur 100 (*)    All other components within normal limits  URINE MICROSCOPIC-ADD ON - Abnormal; Notable for the following:    Squamous Epithelial / LPF 0-5 (*)    Bacteria, UA FEW (*)    All other components within normal limits  LIPASE, BLOOD  URINE RAPID DRUG SCREEN, HOSP PERFORMED  PREGNANCY, URINE    Imaging Review No results found. I have personally reviewed and evaluated these images and lab results as part of my medical decision-making.   EKG Interpretation None      MDM   Final diagnoses:  None    Po challenged successfully in the ED.  Exam and vitals are benign and reassuring there is no indication for imaging at this time.  Will send home with zofran odt and close follow up.  Strict return precautions given    Tikisha Molinaro, MD 07/25/15 [phone]

## 2015-09-10 ENCOUNTER — Encounter (HOSPITAL_COMMUNITY): Payer: Self-pay | Admitting: *Deleted

## 2015-09-10 ENCOUNTER — Inpatient Hospital Stay (HOSPITAL_COMMUNITY): Payer: Medicaid Other

## 2015-09-10 ENCOUNTER — Inpatient Hospital Stay (HOSPITAL_COMMUNITY)
Admission: AD | Admit: 2015-09-10 | Discharge: 2015-09-10 | Disposition: A | Discharge status: Home / Self Care | Payer: Medicaid Other | Source: Ambulatory Visit | Attending: Obstetrics | Admitting: Obstetrics

## 2015-09-10 DIAGNOSIS — Z9889 Other specified postprocedural states: Secondary | ICD-10-CM | POA: Insufficient documentation

## 2015-09-10 DIAGNOSIS — Z79899 Other long term (current) drug therapy: Secondary | ICD-10-CM | POA: Diagnosis not present

## 2015-09-10 DIAGNOSIS — R109 Unspecified abdominal pain: Secondary | ICD-10-CM | POA: Insufficient documentation

## 2015-09-10 DIAGNOSIS — O26891 Other specified pregnancy related conditions, first trimester: Secondary | ICD-10-CM | POA: Diagnosis present

## 2015-09-10 DIAGNOSIS — Z3A01 Less than 8 weeks gestation of pregnancy: Secondary | ICD-10-CM | POA: Diagnosis not present

## 2015-09-10 DIAGNOSIS — Z87891 Personal history of nicotine dependence: Secondary | ICD-10-CM | POA: Insufficient documentation

## 2015-09-10 DIAGNOSIS — F329 Major depressive disorder, single episode, unspecified: Secondary | ICD-10-CM | POA: Insufficient documentation

## 2015-09-10 DIAGNOSIS — O209 Hemorrhage in early pregnancy, unspecified: Secondary | ICD-10-CM | POA: Diagnosis not present

## 2015-09-10 DIAGNOSIS — O26899 Other specified pregnancy related conditions, unspecified trimester: Secondary | ICD-10-CM

## 2015-09-10 LAB — CBC
HEMATOCRIT: 36.3 % (ref 36.0–46.0)
Hemoglobin: 12 g/dL (ref 12.0–15.0)
MCH: 23.2 pg — ABNORMAL LOW (ref 26.0–34.0)
MCHC: 33.1 g/dL (ref 30.0–36.0)
MCV: 70.2 fL — AB (ref 78.0–100.0)
Platelets: 243 10*3/uL (ref 150–400)
RBC: 5.17 MIL/uL — ABNORMAL HIGH (ref 3.87–5.11)
RDW: 14.3 % (ref 11.5–15.5)
WBC: 6.1 10*3/uL (ref 4.0–10.5)

## 2015-09-10 LAB — URINALYSIS, ROUTINE W REFLEX MICROSCOPIC
Bilirubin Urine: NEGATIVE
Glucose, UA: NEGATIVE mg/dL
HGB URINE DIPSTICK: NEGATIVE
Ketones, ur: NEGATIVE mg/dL
Leukocytes, UA: NEGATIVE
Nitrite: NEGATIVE
Protein, ur: NEGATIVE mg/dL
pH: 5.5 (ref 5.0–8.0)

## 2015-09-10 LAB — HCG, QUANTITATIVE, PREGNANCY: hCG, Beta Chain, Quant, S: 861 m[IU]/mL — ABNORMAL HIGH (ref <5)

## 2015-09-10 LAB — WET PREP, GENITAL
CLUE CELLS WET PREP: NONE SEEN
SPERM: NONE SEEN
TRICH WET PREP: NONE SEEN
Yeast Wet Prep HPF POC: NONE SEEN

## 2015-09-10 LAB — POCT PREGNANCY, URINE: Preg Test, Ur: POSITIVE — AB

## 2015-09-10 NOTE — MAU Note (Signed)
+  HPT past wk.  Has been cramping in lower abd - more to the left side.  Had spotting on Friday, clumpy brown- only when she wiped.

## 2015-09-10 NOTE — Discharge Instructions (Signed)

## 2015-09-10 NOTE — MAU Provider Note (Signed)
Chief Complaint: Abdominal Pain and Possible Pregnancy   First Provider Initiated Contact with Patient 09/10/15 0749        SUBJECTIVE  Christina Greene is a 35 y.o. [id number] at 2926w5d by LMP who presents to maternity admissions reporting Left lower abdominal cramping. Also had some brown spotting yesterday.  . She denies red vaginal bleeding, vaginal itching/burning, urinary symptoms, h/a, dizziness, n/v, or fever/chills.     Abdominal Pain This is a new problem. The current episode started yesterday. The onset quality is gradual. The problem occurs intermittently. The problem has been unchanged. The pain is located in the LLQ. The pain is mild. The quality of the pain is aching and cramping. The abdominal pain does not radiate. Pertinent negatives include no constipation, diarrhea, fever, headaches, myalgias, nausea or vomiting. Nothing aggravates the pain. The pain is relieved by nothing. She has tried nothing for the symptoms.   RN Note:  +HPT past wk. Has been cramping in lower abd - more to the left side. Had spotting on Friday, clumpy brown- only when she wiped.            Past Medical History  Diagnosis Date  . Ectopic pregnancy   . Ovarian cyst   . Abnormal Pap smear     colpo ok  . Depression     doing ok   Past Surgical History  Procedure Laterality Date  . Dilation and curettage of uterus    . Laparotomy      ruptured ectopic, rt tube; ovarian cyst  . Wisdom tooth extraction    . Nasal fracture surgery  2002  . Hand surgery Right   . Laparoscopic appendectomy N/A 10/04/2013    Procedure: APPENDECTOMY LAPAROSCOPIC;  Surgeon: Adolph Pollackodd J Rosenbower, MD;  Location: [facility];  Service: General;  Laterality: N/A;   Social History   Social History  . Marital Status: Legally Separated    Spouse Name: N/A  . Number of Children: N/A  . Years of Education: N/A   Occupational History  . CNA    Social History Main Topics  . Smoking status: Former Smoker -- 4 years     Types: Cigars  . Smokeless tobacco: Never Used     Comment: black and milds  . Alcohol Use: 0.0 oz/week    0 Standard drinks or equivalent per week     Comment: occ  . Drug Use: No  . Sexual Activity:    Partners: Male    Birth Control/ Protection: None, Pill   Other Topics Concern  . Not on file   Social History Narrative   No current facility-administered medications on file prior to encounter.   Current Outpatient Prescriptions on File Prior to Encounter  Medication Sig Dispense Refill  . etonogestrel-ethinyl estradiol (NUVARING) 0.12-0.015 MG/24HR vaginal ring Insert vaginally and leave in place for 3 consecutive weeks, then remove for 1 week. (Patient not taking: Reported on 09/10/2015) 1 each 12  . guaiFENesin-codeine (ROBITUSSIN AC) 100-10 MG/5ML syrup Take 10 mLs by mouth 4 (four) times daily as needed for cough. (Patient not taking: Reported on 09/10/2015) 180 mL 0  . ibuprofen (ADVIL,MOTRIN) 800 MG tablet Take 1 tablet (800 mg total) by mouth 3 (three) times daily. (Patient not taking: Reported on 09/10/2015) 21 tablet 0  . ipratropium (ATROVENT) 0.06 % nasal spray Place 2 sprays into both nostrils 4 (four) times daily. (Patient not taking: Reported on 09/10/2015) 15 mL 1  . levonorgestrel-ethinyl estradiol (ENPRESSE,TRIVORA) tablet Take 1 tablet by  mouth daily. (Patient not taking: Reported on 09/10/2015) 1 Package 11  . ondansetron (ZOFRAN ODT) 8 MG disintegrating tablet  ODT q8 hours prn nausea (Patient not taking: Reported on 09/10/2015) 12 tablet 0   No Known Allergies  I have reviewed patient's Past Medical Hx, Surgical Hx, Family Hx, Social Hx, medications and allergies.   ROS:  Review of Systems  Constitutional: Negative for fever and chills.  Gastrointestinal: Positive for abdominal pain. Negative for nausea, vomiting, diarrhea and constipation.  Genitourinary: Positive for vaginal bleeding. Negative for flank pain, vaginal discharge, difficulty urinating and vaginal  pain.  Musculoskeletal: Negative for myalgias.  Neurological: Negative for headaches.   Other systems negative  Physical Exam  Patient Vitals for the past 24 hrs:  BP Temp Temp src Pulse Height Weight  09/10/15 0727 140/88 mmHg 98.5 F (36.9 C) Oral 86 - -  09/10/15 0715 - - - -  (1.651 m) 184 lb 9.6 oz (83.734 kg)    Physical Exam  Constitutional: Well-developed, well-nourished female in no acute distress.  Cardiovascular: normal rate Respiratory: normal effort GI: Abd soft, non-tender. Pos BS x 4 MS: Extremities nontender, no edema, normal ROM Neurologic: Alert and oriented x 4.  GU: Neg CVAT.  PELVIC EXAM: Cervix pink, visually closed, without lesion, scant white tan discharge, vaginal walls and external genitalia normal Bimanual exam: Cervix 0/long/high, firm, anterior, neg CMT, uterus nontender, nonenlarged, adnexa without tenderness, enlargement, or mass   LAB RESULTS Results for orders placed or performed during the hospital encounter of 09/10/15 (from the past 24 hour(s))  Urinalysis, Routine w reflex microscopic (not at [facility])     Status: Abnormal   Collection Time: 09/10/15  7:10 AM  Result Value Ref Range   Color, Urine YELLOW YELLOW   APPearance CLEAR CLEAR   Specific Gravity, Urine >1.030 (H) 1.005 - 1.030   pH 5.5 5.0 - 8.0   Glucose, UA NEGATIVE NEGATIVE mg/dL   Hgb urine dipstick NEGATIVE NEGATIVE   Bilirubin Urine NEGATIVE NEGATIVE   Ketones, ur NEGATIVE NEGATIVE mg/dL   Protein, ur NEGATIVE NEGATIVE mg/dL   Nitrite NEGATIVE NEGATIVE   Leukocytes, UA NEGATIVE NEGATIVE  Pregnancy, urine POC     Status: Abnormal   Collection Time: 09/10/15  7:20 AM  Result Value Ref Range   Preg Test, Ur POSITIVE (A) NEGATIVE  CBC     Status: Abnormal   Collection Time: 09/10/15  7:58 AM  Result Value Ref Range   WBC 6.1 4.0 - 10.5 K/uL   RBC 5.17 (H) 3.87 - 5.11 MIL/uL   Hemoglobin 12.0 12.0 - 15.0 g/dL   HCT 16.1 09.6 - 04.5 %   MCV 70.2 (L) 78.0 - 100.0 fL    MCH 23.2 (L) 26.0 - 34.0 pg   MCHC 33.1 30.0 - 36.0 g/dL   RDW 40.9 81.1 - 91.4 %   Platelets 243 150 - 400 K/uL  hCG, quantitative, pregnancy     Status: Abnormal   Collection Time: 09/10/15  7:58 AM  Result Value Ref Range   hCG, Beta Chain, Quant, S 861 (H) <5 mIU/mL  Wet prep, genital     Status: Abnormal   Collection Time: 09/10/15  8:20 AM  Result Value Ref Range   Yeast Wet Prep HPF POC NONE SEEN NONE SEEN   Trich, Wet Prep NONE SEEN NONE SEEN   Clue Cells Wet Prep HPF POC NONE SEEN NONE SEEN   WBC, Wet Prep HPF POC FEW (A) NONE SEEN  Sperm NONE SEEN    \    IMAGING  US Ob Comp Less 14 Wks  09/10/2015  CLINICAL DATA:  Intermittent cramping. Early pregnancy. History of right ectopic pregnancy with removal of the right fallopian tube. EXAM: OBSTETRIC <14 WK ULTRASOUND TECHNIQUE: Transabdominal ultrasound was performed for evaluation of the gestation as well as the maternal uterus and adnexal regions. COMPARISON:  None. FINDINGS: There is a small fluid collection in the endometrium measuring 2.5 mm in mean diameter. If this is a gestational sac it would correlate with a gestational age of [redacted] weeks and 6 days. No yolk sac or fetal pole identified. No evidence of subchorionic hemorrhage. The ovaries are normal in appearance. No free fluid in the pelvis or cul-de-sac. IMPRESSION: 1. No intrauterine pregnancy is definitively identified. A tiny fluid collection in the endometrium could represent a very early gestational sac but this is not certain. As a result, the lack of a definitive IUP in a pregnant patient could be seen with ectopic pregnancy, early pregnancy, or recent miscarriage. Recommend follow-up as clinically warranted. Electronically Signed   By: Gerome Sam III M.D   On: 09/10/2015 09:49   US Ob Transvaginal  09/10/2015  CLINICAL DATA:  Intermittent cramping. Early pregnancy. History of right ectopic pregnancy with removal of the right fallopian tube. EXAM: OBSTETRIC <14  WK ULTRASOUND TECHNIQUE: Transabdominal ultrasound was performed for evaluation of the gestation as well as the maternal uterus and adnexal regions. COMPARISON:  None. FINDINGS: There is a small fluid collection in the endometrium measuring 2.5 mm in mean diameter. If this is a gestational sac it would correlate with a gestational age of [redacted] weeks and 6 days. No yolk sac or fetal pole identified. No evidence of subchorionic hemorrhage. The ovaries are normal in appearance. No free fluid in the pelvis or cul-de-sac. IMPRESSION: 1. No intrauterine pregnancy is definitively identified. A tiny fluid collection in the endometrium could represent a very early gestational sac but this is not certain. As a result, the lack of a definitive IUP in a pregnant patient could be seen with ectopic pregnancy, early pregnancy, or recent miscarriage. Recommend follow-up as clinically warranted. Electronically Signed   By: Gerome Sam III M.D   On: 09/10/2015 09:49    MAU Management/MDM: Ordered labs and reviewed results.     This bleeding could represent a normal pregnancy with bleeding, spontaneous abortion or even an ectopic which can be life-threatening.   Cultures were done to rule out pelvic infection Blood drawn for Quant HCG, CBC, ABO/Rh  Pt stable at time of discharge.  ASSESSMENT 1. Abdominal pain affecting pregnancy, antepartum     PLAN Discharge home Plan repeat Quant hcg on Monday to see if it doubles Ectopic precautions    Medication List    ASK your doctor about these medications        etonogestrel-ethinyl estradiol 0.12-0.015 MG/24HR vaginal ring  Commonly known as:  NUVARING  Insert vaginally and leave in place for 3 consecutive weeks, then remove for 1 week.     guaiFENesin-codeine 100-10 MG/5ML syrup  Commonly known as:  ROBITUSSIN AC  Take 10 mLs by mouth 4 (four) times daily as needed for cough.     ibuprofen 800 MG tablet  Commonly known as:  ADVIL,MOTRIN  Take 1 tablet  (800 mg total) by mouth 3 (three) times daily.     ipratropium 0.06 % nasal spray  Commonly known as:  ATROVENT  Place 2 sprays into both nostrils 4 (  four) times daily.     levonorgestrel-ethinyl estradiol tablet  Commonly known as:  ENPRESSE,TRIVORA  Take 1 tablet by mouth daily.     ondansetron 8 MG disintegrating tablet  Commonly known as:  ZOFRAN ODT  8mg  ODT q8 hours prn nausea         Encouraged to return here or to other Urgent Care/ED if she develops worsening of symptoms, increase in pain, fever, or other concerning symptoms.    Wynelle Bourgeois CNM, MSN Certified Nurse-Midwife 09/10/2015  8:23 AM

## 2015-09-11 LAB — HIV ANTIBODY (ROUTINE TESTING W REFLEX): HIV Screen 4th Generation wRfx: NONREACTIVE

## 2015-09-12 ENCOUNTER — Ambulatory Visit: Payer: Medicaid Other | Admitting: Obstetrics & Gynecology

## 2015-09-12 DIAGNOSIS — O3680X0 Pregnancy with inconclusive fetal viability, not applicable or unspecified: Secondary | ICD-10-CM

## 2015-09-12 LAB — GC/CHLAMYDIA PROBE AMP (~~LOC~~) NOT AT ARMC
Chlamydia: NEGATIVE
Neisseria Gonorrhea: NEGATIVE

## 2015-09-12 LAB — HCG, QUANTITATIVE, PREGNANCY: hCG, Beta Chain, Quant, S: 2227 m[IU]/mL — ABNORMAL HIGH (ref <5)

## 2015-09-12 NOTE — Progress Notes (Signed)
   Subjective:    Patient ID: Christina Greene, female    DOB: February 08, 1981, 35 y.o.   MRN: [id number]  HPI  [id number]. She had an appropiate rise.  Review of Systems     Objective:   Physical Exam        Assessment & Plan:  She will follow up for prenatal care

## 2015-09-14 ENCOUNTER — Other Ambulatory Visit: Payer: Medicaid Other

## 2015-09-14 DIAGNOSIS — O3680X0 Pregnancy with inconclusive fetal viability, not applicable or unspecified: Secondary | ICD-10-CM

## 2015-09-15 ENCOUNTER — Inpatient Hospital Stay (HOSPITAL_COMMUNITY)
Admission: AD | Admit: 2015-09-15 | Discharge: 2015-09-15 | Disposition: A | Discharge status: Home / Self Care | Payer: Medicaid Other | Source: Ambulatory Visit | Attending: Obstetrics | Admitting: Obstetrics

## 2015-09-15 ENCOUNTER — Inpatient Hospital Stay (HOSPITAL_COMMUNITY): Payer: Medicaid Other

## 2015-09-15 ENCOUNTER — Telehealth: Payer: Self-pay | Admitting: *Deleted

## 2015-09-15 DIAGNOSIS — O9989 Other specified diseases and conditions complicating pregnancy, childbirth and the puerperium: Secondary | ICD-10-CM

## 2015-09-15 DIAGNOSIS — Z79899 Other long term (current) drug therapy: Secondary | ICD-10-CM | POA: Insufficient documentation

## 2015-09-15 DIAGNOSIS — Z3A01 Less than 8 weeks gestation of pregnancy: Secondary | ICD-10-CM | POA: Diagnosis not present

## 2015-09-15 DIAGNOSIS — O26899 Other specified pregnancy related conditions, unspecified trimester: Secondary | ICD-10-CM

## 2015-09-15 DIAGNOSIS — F329 Major depressive disorder, single episode, unspecified: Secondary | ICD-10-CM | POA: Insufficient documentation

## 2015-09-15 DIAGNOSIS — R109 Unspecified abdominal pain: Secondary | ICD-10-CM | POA: Insufficient documentation

## 2015-09-15 DIAGNOSIS — O26891 Other specified pregnancy related conditions, first trimester: Secondary | ICD-10-CM | POA: Diagnosis not present

## 2015-09-15 DIAGNOSIS — Z87891 Personal history of nicotine dependence: Secondary | ICD-10-CM | POA: Insufficient documentation

## 2015-09-15 LAB — CBC
HCT: 35.1 % — ABNORMAL LOW (ref 36.0–46.0)
Hemoglobin: 11.7 g/dL — ABNORMAL LOW (ref 12.0–15.0)
MCH: 23.3 pg — ABNORMAL LOW (ref 26.0–34.0)
MCHC: 33.3 g/dL (ref 30.0–36.0)
MCV: 69.9 fL — ABNORMAL LOW (ref 78.0–100.0)
PLATELETS: 230 10*3/uL (ref 150–400)
RBC: 5.02 MIL/uL (ref 3.87–5.11)
RDW: 14.4 % (ref 11.5–15.5)
WBC: 7.2 10*3/uL (ref 4.0–10.5)

## 2015-09-15 LAB — BETA HCG QUANT (REF LAB): hCG Quant: 2775 m[IU]/mL

## 2015-09-15 LAB — HCG, QUANTITATIVE, PREGNANCY: hCG, Beta Chain, Quant, S: 4446 m[IU]/mL — ABNORMAL HIGH (ref <5)

## 2015-09-15 NOTE — MAU Note (Signed)
Urine sent to lab 

## 2015-09-15 NOTE — MAU Note (Signed)
Pt presents for repeat bHCG. Pt also having left lower abdominal pain that is starting to get worse than before. Denies bleeding.

## 2015-09-15 NOTE — Discharge Instructions (Signed)

## 2015-09-15 NOTE — MAU Provider Note (Signed)
History     CSN: [id number]  Arrival date and time: 09/15/15 1934   None     Chief Complaint  Patient presents with  . Follow-up   HPIpt is [redacted]w[redacted]d pregnant [id number] who presents with increase in abdominal pain.  Pt has previously been evaluated in MAU on 09/10/2015 concern for ectopic pregnancy- no IUP definitely identified; no YS or fetal pole; small fluid collection in endometrium possible IUGS. Her HCG was 861.  Repeat HCG on 4/10 was 2227(office)  office note states pt is to return on 4/14(tomorrow) for repeat HCG and [place], but pt states she was to return today-Dr. Verdell Carmine office called her this afternoon and requested she come to MAU this evening;  her pain is increasing on LLQ. Pt denies spotting/bleeding.  RN note:  Expand All Collapse All   Pt presents for repeat bHCG. Pt also having left lower abdominal pain that is starting to get worse than before. Denies bleeding.        Past Medical History  Diagnosis Date  . Ectopic pregnancy   . Ovarian cyst   . Abnormal Pap smear     colpo ok  . Depression     doing ok    Past Surgical History  Procedure Laterality Date  . Dilation and curettage of uterus    . Laparotomy      ruptured ectopic, rt tube; ovarian cyst  . Wisdom tooth extraction    . Nasal fracture surgery  2002  . Hand surgery Right   . Laparoscopic appendectomy N/A 10/04/2013    Procedure: APPENDECTOMY LAPAROSCOPIC;  Surgeon: Adolph Pollack, MD;  Location: [facility];  Service: General;  Laterality: N/A;    Family History  Problem Relation Age of Onset  . Hypertension Mother   . Hypothyroidism Mother   . Diabetes Father   . Liver cancer Father   . Hepatitis Father   . Other Neg Hx     Social History  Substance Use Topics  . Smoking status: Former Smoker -- 4 years    Types: Cigars  . Smokeless tobacco: Never Used     Comment: black and milds  . Alcohol Use: 0.0 oz/week    0 Standard drinks or equivalent per week     Comment: occ    Allergies:  No Known Allergies  Prescriptions prior to admission  Medication Sig Dispense Refill Last Dose  . guaiFENesin-codeine (ROBITUSSIN AC) 100-10 MG/5ML syrup Take 10 mLs by mouth 4 (four) times daily as needed for cough. (Patient not taking: Reported on 09/10/2015) 180 mL 0   . ipratropium (ATROVENT) 0.06 % nasal spray Place 2 sprays into both nostrils 4 (four) times daily. (Patient not taking: Reported on 09/10/2015) 15 mL 1     Review of Systems  Constitutional: Negative for fever and chills.  Gastrointestinal: Positive for abdominal pain. Negative for nausea, vomiting, diarrhea and constipation.  Genitourinary: Negative for dysuria.   Physical Exam   Blood pressure 133/79, pulse 84, temperature 98 F (36.7 C), temperature source Oral, resp. rate 18, height  (1.651 m), weight 186 lb 9.6 oz (84.641 kg), last menstrual period 08/08/2015.  Physical Exam  Nursing note and vitals reviewed. Constitutional: She is oriented to person, place, and time. She appears well-developed and well-nourished. No distress.  HENT:  Head: Normocephalic.  Eyes: Pupils are equal, round, and reactive to light.  Neck: Normal range of motion. Neck supple.  Cardiovascular: Normal rate.   Respiratory: Effort normal.  GI: Soft.  Musculoskeletal:  Normal range of motion.  Neurological: She is alert and oriented to person, place, and time.  Skin: Skin is warm and dry.  Psychiatric: She has a normal mood and affect.    MAU Course  Procedures Results for orders placed or performed during the hospital encounter of 09/15/15 (from the past 24 hour(s))  CBC     Status: Abnormal   Collection Time: 09/15/15  8:04 PM  Result Value Ref Range   WBC 7.2 4.0 - 10.5 K/uL   RBC 5.02 3.87 - 5.11 MIL/uL   Hemoglobin 11.7 (L) 12.0 - 15.0 g/dL   HCT 16.135.1 (L) 09.636.0 - 04.546.0 %   MCV 69.9 (L) 78.0 - 100.0 fL   MCH 23.3 (L) 26.0 - 34.0 pg   MCHC 33.3 30.0 - 36.0 g/dL   RDW 40.914.4 81.111.5 - 91.415.5 %   Platelets 230 150 - 400 K/uL  hCG,  quantitative, pregnancy     Status: Abnormal   Collection Time: 09/15/15  8:04 PM  Result Value Ref Range   hCG, Beta Chain, Quant, S 4446 (H) <5 mIU/mL                 Ref Range 8:04 PM  3d ago  5d ago  4442yr ago     hCG, Beta Chain, Quant, S <5 mIU/mL 4446 (H) 2227 (H)CM 861 (H)CM         [place] Ob Transvaginal  09/15/2015  CLINICAL DATA:  LEFT lower quadrant pain, pregnant, followup fetal viability ; quantitative beta HCG not yet available for correlation ; EGA [redacted] weeks 3 days by LMP EXAM: TRANSVAGINAL OB ULTRASOUND TECHNIQUE: Transvaginal ultrasound was performed for complete evaluation of the gestation as well as the maternal uterus, adnexal regions, and pelvic cul-de-sac. COMPARISON:  None. FINDINGS: Intrauterine gestational sac: Present Yolk sac:  Present Embryo:  Not identified Cardiac Activity: N/A Heart Rate: N/A bpm MSD: 7.6  mm   5 w   4  d Subchorionic hemorrhage:  None visualized. Maternal uterus/adnexae: Ovaries normal appearance. Blood flow present within both ovaries on color Doppler imaging. No free pelvic fluid or adnexal masses. IMPRESSION: Small gestational sac within the uterus containing a yolk sac but no fetal pole is identified to establish viability. Consider followup in 10-14 days if clinically indicated. Electronically Signed   By: Ulyses SouthwardMark  Boles M.D.   On: 09/15/2015 20:55  discussed with pt that this is reassuring that she does not have ectopic pregnancy  Assessment and Plan  abdominal pain in preg- tylenol prn, comfort measures IUGS with YS F/U with Dr. Clearance CootsHarper- pt to call on Monday for appointment LINEBERRY,SUSAN 09/15/2015, 7:55 PM

## 2015-09-15 NOTE — MAU Note (Signed)
Discharged from lobby by NP.

## 2015-09-15 NOTE — Telephone Encounter (Signed)
Call placed to pt to review lab results.  Pt was made aware that Dr Clearance CootsHarper would like for [facility] for a repeat quant level.  Pt was made aware that Dr Clearance CootsHarper would like for her to stay at the hospital until labs have resulted and that she may need further testing.  Pt also made aware that Dr Clearance CootsHarper does not currently want to treat for BV/yeast since she had negative results last week. Pt made aware that if she would like she [facility]. Pt states understanding.

## 2015-09-21 ENCOUNTER — Encounter: Payer: Self-pay | Admitting: Certified Nurse Midwife

## 2015-09-21 ENCOUNTER — Ambulatory Visit (INDEPENDENT_AMBULATORY_CARE_PROVIDER_SITE_OTHER): Payer: Medicaid Other | Admitting: Certified Nurse Midwife

## 2015-09-21 VITALS — BP 121/84 | HR 87 | Temp 98.4°F | Wt 185.0 lb

## 2015-09-21 DIAGNOSIS — N760 Acute vaginitis: Secondary | ICD-10-CM

## 2015-09-21 DIAGNOSIS — Z3490 Encounter for supervision of normal pregnancy, unspecified, unspecified trimester: Secondary | ICD-10-CM

## 2015-09-21 MED ORDER — TERCONAZOLE 0.4 % VA CREA: 1.0000 | TOPICAL_CREAM | Freq: Every day | VAGINAL | Status: DC

## 2015-09-21 MED ORDER — METRONIDAZOLE 0.75 % VA GEL: 1.0000 | Freq: Two times a day (BID) | VAGINAL | Status: DC

## 2015-09-21 MED ORDER — VITAFOL GUMMIES 3.33-0.333-34.8 MG PO CHEW: 3.0000 | CHEWABLE_TABLET | Freq: Every day | ORAL | Status: DC

## 2015-09-21 NOTE — Addendum Note (Signed)
Addended by: Marya LandryFOSTER, Kiriana Worthington D on: 09/21/2015 10:26 AM   Modules accepted: Orders

## 2015-09-21 NOTE — Progress Notes (Signed)
Patient ID: Christina Greene, female   DOB: [birth year], 35 y.o.   MRN: [id number]  Chief Complaint  Patient presents with  . Vaginal Itching    HPI Christina Evertsshley S Scaglione is a 35 y.o. female.  Here for vaginal discharge with odor, denies any itching or problems with urination.  Desires to be treated for BV.  Has tried one dose of metrogel.  Is recently pregnant with early gestation of 2646w4d by US 09/15/15.  US results reviwed.  Patient is not taking any prenatal vitamins.  Liked the [employer] birth control but forgot to put it back in 6 weeks after starting to use it.  Then became pregnant.  Hx of ectopic pregnancy.  HCG levels doubled.     HPI  Past Medical History  Diagnosis Date  . Ectopic pregnancy   . Ovarian cyst   . Abnormal Pap smear     colpo ok  . Depression     doing ok    Past Surgical History  Procedure Laterality Date  . Dilation and curettage of uterus    . Laparotomy      ruptured ectopic, rt tube; ovarian cyst  . Wisdom tooth extraction    . Nasal fracture surgery  2002  . Hand surgery Right   . Laparoscopic appendectomy N/A 10/04/2013    Procedure: APPENDECTOMY LAPAROSCOPIC;  Surgeon: Adolph Pollackodd J Rosenbower, MD;  Location: [facility];  Service: General;  Laterality: N/A;    Family History  Problem Relation Age of Onset  . Hypertension Mother   . Hypothyroidism Mother   . Diabetes Father   . Liver cancer Father   . Hepatitis Father   . Other Neg Hx     Social History Social History  Substance Use Topics  . Smoking status: Former Smoker -- 4 years    Types: Cigars  . Smokeless tobacco: Never Used     Comment: black and milds  . Alcohol Use: 0.0 oz/week    0 Standard drinks or equivalent per week     Comment: occ    No Known Allergies  Current Outpatient Prescriptions  Medication Sig Dispense Refill  . metroNIDAZOLE (METROGEL VAGINAL) 0.75 % vaginal gel Place 1 Applicatorful vaginally 2 (two) times daily. 70 g 0  . Prenatal Vit-Fe Phos-FA-Omega (VITAFOL  GUMMIES) 3.33-0.333-34.8 MG CHEW Chew 3 tablets by mouth daily. 90 tablet 12  . terconazole (TERAZOL 7) 0.4 % vaginal cream Place 1 applicator vaginally at bedtime. 45 g 0   No current facility-administered medications for this visit.    Review of Systems Review of Systems Constitutional: negative for fatigue and weight loss Respiratory: negative for cough and wheezing Cardiovascular: negative for chest pain, fatigue and palpitations Gastrointestinal: negative for abdominal pain and change in bowel habits Genitourinary: +vaginal discharge Integument/breast: negative for nipple discharge Musculoskeletal:negative for myalgias Neurological: negative for gait problems and tremors Behavioral/Psych: negative for abusive relationship, depression Endocrine: negative for temperature intolerance     Blood pressure 121/84, pulse 87, temperature 98.4 F (36.9 C), weight 185 lb (83.915 kg), last menstrual period 08/08/2015.  Physical Exam Physical Exam General:   alert  Skin:   no rash or abnormalities  Lungs:   clear to auscultation bilaterally  Heart:   regular rate and rhythm, S1, S2 normal, no murmur, click, rub or gallop  Breasts:   deferred  Abdomen:  normal findings: no organomegaly, soft, non-tender and no hernia  Pelvis:  External genitalia: normal general appearance Urinary system: urethral meatus normal and bladder  without fullness, nontender Vaginal: normal without tenderness, induration or masses Cervix: no CMT, cervix long, thick, closed and posterior.  Adnexa: normal bimanual exam Uterus: anteverted and non-tender, normal size    50% of 15 min visit spent on counseling and coordination of care.   Data Reviewed Previous medical hx, meds, labs, [place]  Assessment     Early pregnancy Vaginitis     Plan    Orders Placed This Encounter  Procedures  . US OB Comp Less 14 Wks    Standing Status: Future     Number of Occurrences:      Standing Expiration Date: 11/20/2016     Scheduling Instructions:     Schedule for 2 weeks from now.    Order Specific Question:  Reason for Exam (SYMPTOM  OR DIAGNOSIS REQUIRED)    Answer:  viability    Order Specific Question:  Preferred imaging location?    Answer:  [facility]   Meds ordered this encounter  Medications  . metroNIDAZOLE (METROGEL VAGINAL) 0.75 % vaginal gel    Sig: Place 1 Applicatorful vaginally 2 (two) times daily.    Dispense:  70 g    Refill:  0  . terconazole (TERAZOL 7) 0.4 % vaginal cream    Sig: Place 1 applicator vaginally at bedtime.    Dispense:  45 g    Refill:  0  . Prenatal Vit-Fe Phos-FA-Omega (VITAFOL GUMMIES) 3.33-0.333-34.8 MG CHEW    Sig: Chew 3 tablets by mouth daily.    Dispense:  90 tablet    Refill:  12     Follow up with NOB appointment in 4 weeks.

## 2015-09-23 ENCOUNTER — Telehealth: Payer: Self-pay | Admitting: *Deleted

## 2015-09-23 NOTE — Telephone Encounter (Signed)
Pt called to office stating she had been having some cramping and spotting.  Return call to pt.  She states that she had some cramping over the night and was having some spotting when she used the bathroom.  Pt states that she did not have any bleeding on her panty liner. Pt states that today has been better, no cramping or bleeding.  Pt was advised to monitor symptoms, if they worsen or bleeding increases [facility]. Pt states understanding.

## 2015-09-24 LAB — NUSWAB VAGINITIS PLUS (VG+)
Atopobium vaginae: HIGH Score — AB
CANDIDA ALBICANS, NAA: POSITIVE — AB
CANDIDA GLABRATA, NAA: NEGATIVE
CHLAMYDIA TRACHOMATIS, NAA: NEGATIVE
Megasphaera 1: HIGH Score — AB
NEISSERIA GONORRHOEAE, NAA: NEGATIVE
Trich vag by NAA: NEGATIVE

## 2015-09-28 ENCOUNTER — Other Ambulatory Visit: Payer: Self-pay | Admitting: Certified Nurse Midwife

## 2015-10-06 ENCOUNTER — Ambulatory Visit (HOSPITAL_COMMUNITY)
Admission: RE | Admit: 2015-10-06 | Discharge: 2015-10-06 | Disposition: A | Discharge status: Home / Self Care | Payer: Medicaid Other | Source: Ambulatory Visit | Attending: Certified Nurse Midwife | Admitting: Certified Nurse Midwife

## 2015-10-06 ENCOUNTER — Other Ambulatory Visit: Payer: Self-pay | Admitting: Certified Nurse Midwife

## 2015-10-06 DIAGNOSIS — O34531 Maternal care for retroversion of gravid uterus, first trimester: Secondary | ICD-10-CM | POA: Diagnosis not present

## 2015-10-06 DIAGNOSIS — Z3A08 8 weeks gestation of pregnancy: Secondary | ICD-10-CM

## 2015-10-06 DIAGNOSIS — O3680X0 Pregnancy with inconclusive fetal viability, not applicable or unspecified: Secondary | ICD-10-CM

## 2015-10-06 DIAGNOSIS — O209 Hemorrhage in early pregnancy, unspecified: Secondary | ICD-10-CM | POA: Insufficient documentation

## 2015-10-06 DIAGNOSIS — Z3490 Encounter for supervision of normal pregnancy, unspecified, unspecified trimester: Secondary | ICD-10-CM

## 2015-10-18 ENCOUNTER — Ambulatory Visit (INDEPENDENT_AMBULATORY_CARE_PROVIDER_SITE_OTHER): Payer: Medicaid Other | Admitting: Certified Nurse Midwife

## 2015-10-18 VITALS — BP 115/68 | HR 84 | Temp 98.7°F | Wt 187.0 lb

## 2015-10-18 DIAGNOSIS — Z1389 Encounter for screening for other disorder: Secondary | ICD-10-CM | POA: Diagnosis not present

## 2015-10-18 DIAGNOSIS — Z3481 Encounter for supervision of other normal pregnancy, first trimester: Secondary | ICD-10-CM

## 2015-10-18 DIAGNOSIS — Z3491 Encounter for supervision of normal pregnancy, unspecified, first trimester: Secondary | ICD-10-CM | POA: Diagnosis not present

## 2015-10-18 DIAGNOSIS — Z331 Pregnant state, incidental: Secondary | ICD-10-CM | POA: Diagnosis not present

## 2015-10-18 DIAGNOSIS — Z3480 Encounter for supervision of other normal pregnancy, unspecified trimester: Secondary | ICD-10-CM | POA: Insufficient documentation

## 2015-10-18 DIAGNOSIS — K5901 Slow transit constipation: Secondary | ICD-10-CM

## 2015-10-18 LAB — POCT URINALYSIS DIPSTICK
Bilirubin, UA: NEGATIVE
Blood, UA: NEGATIVE
Glucose, UA: NEGATIVE
Ketones, UA: NEGATIVE
LEUKOCYTES UA: NEGATIVE
NITRITE UA: NEGATIVE
PROTEIN UA: NEGATIVE
Spec Grav, UA: 1.015
UROBILINOGEN UA: NEGATIVE
pH, UA: 6

## 2015-10-18 MED ORDER — DOCUSATE SODIUM 100 MG PO CAPS: 200.0000 mg | ORAL_CAPSULE | Freq: Every day | ORAL | Status: DC

## 2015-10-18 NOTE — Progress Notes (Signed)
Subjective:    Christina Greene is being seen today for her first obstetrical visit.  This is not a planned pregnancy. She is at 2988w1d gestation. Her obstetrical history is significant for hx of ectopic pregnancy. Relationship with FOB: not involved. Patient does intend to breast feed. Pregnancy history fully reviewed.  The information documented in the HPI was reviewed and verified.  Menstrual History: OB History    Gravida Para Term Preterm AB TAB SAB Ectopic Multiple Living   5 1 1  0 3 0 2 1 0 1      Menarche age: 35 years of age  Patient's last menstrual period was 08/08/2015.    Past Medical History  Diagnosis Date  . Ectopic pregnancy   . Ovarian cyst   . Abnormal Pap smear     colpo ok  . Depression     doing ok    Past Surgical History  Procedure Laterality Date  . Dilation and curettage of uterus    . Laparotomy      ruptured ectopic, rt tube; ovarian cyst  . Wisdom tooth extraction    . Nasal fracture surgery  2002  . Hand surgery Right   . Laparoscopic appendectomy N/A 10/04/2013    Procedure: APPENDECTOMY LAPAROSCOPIC;  Surgeon: Adolph Pollackodd J Rosenbower, MD;  Location: [facility];  Service: General;  Laterality: N/A;     (Not in a hospital admission) No Known Allergies  Social History  Substance Use Topics  . Smoking status: Former Smoker -- 4 years    Types: Cigars  . Smokeless tobacco: Never Used     Comment: black and milds  . Alcohol Use: 0.0 oz/week    0 Standard drinks or equivalent per week     Comment: occ    Family History  Problem Relation Age of Onset  . Hypertension Mother   . Hypothyroidism Mother   . Diabetes Father   . Liver cancer Father   . Hepatitis Father   . Other Neg Hx      Review of Systems Constitutional: negative for weight loss Gastrointestinal: negative for vomiting, negative for nausea, reports emesis 1 time Genitourinary:negative for genital lesions and vaginal discharge and dysuria Musculoskeletal:negative for back  pain Behavioral/Psych: negative for abusive relationship, depression, illegal drug usage and tobacco use    Objective:    BP 115/68 mmHg  Pulse 84  Temp(Src) 37.1 C (98.7 F)  Wt 84823 g  LMP 08/08/2015 General Appearance:    Alert, cooperative, no distress, appears stated age  Head:    Normocephalic, without obvious abnormality, atraumatic  Eyes:    PERRL, conjunctiva/corneas clear, EOM's intact, fundi    benign, both eyes  Ears:    Normal TM's and external ear canals, both ears  Nose:   Nares normal, septum midline, mucosa normal, no drainage    or sinus tenderness  Throat:   Lips, mucosa, and tongue normal; teeth and gums normal  Neck:   Supple, symmetrical, trachea midline, no adenopathy;    thyroid:  no enlargement/tenderness/nodules; no carotid   bruit or JVD  Back:     Symmetric, no curvature, ROM normal, no CVA tenderness  Lungs:     Clear to auscultation bilaterally, respirations unlabored  Chest Wall:    No tenderness or deformity   Heart:    Regular rate and rhythm, S1 and S2 normal, no murmur, rub   or gallop  Breast Exam:    No tenderness, masses, or nipple abnormality  Abdomen:  Soft, non-tender, bowel sounds active all four quadrants,    no masses, no organomegaly  Genitalia:    Normal female without lesion, discharge or tenderness  Extremities:   Extremities normal, atraumatic, no cyanosis or edema  Pulses:   2+ and symmetric all extremities  Skin:   Skin color, texture, turgor normal, no rashes or lesions  Lymph nodes:   Cervical, supraclavicular, and axillary nodes normal  Neurologic:   CNII-XII intact, normal strength, sensation and reflexes    throughout                           Lab Review Urine pregnancy test Labs reviewed yes Radiologic studies reviewed yes Assessment:    Pregnancy at [redacted]w[redacted]d weeks   H/O cutting and depression: stable  Plan:      Prenatal vitamins.  Counseling provided regarding continued use of seat belts, cessation of  alcohol consumption, smoking or use of illicit drugs; infection precautions i.e., influenza/TDAP immunizations, toxoplasmosis,CMV, parvovirus, listeria and varicella; workplace safety, exercise during pregnancy; routine dental care, safe medications, sexual activity, hot tubs, saunas, pools, travel, caffeine use, fish and methlymercury, potential toxins, hair treatments, varicose veins Weight gain recommendations per IOM guidelines reviewed: underweight/BMI< 18.5--> gain 28 - 40 lbs; normal weight/BMI 18.5 - 24.9--> gain 25 - 35 lbs; overweight/BMI 25 - 29.9--> gain 15 - 25 lbs; obese/BMI >30->gain  11 - 20 lbs Problem list reviewed and updated. FIRST/CF mutation testing/NIPT/QUAD SCREEN/fragile X/Ashkenazi Jewish population testing/Spinal muscular atrophy discussed: ordered. Role of ultrasound in pregnancy discussed; fetal survey: requested. Amniocentesis discussed: not indicated. VBAC calculator score: VBAC consent form provided No orders of the defined types were placed in this encounter.   Orders Placed This Encounter  Procedures  . Culture, OB Urine  . TSH  . HIV antibody  . Hemoglobinopathy evaluation  . Varicella zoster antibody, IgG  . Prenatal Profile I  . MaterniT21 PLUS Core+SCA    Order Specific Question:  Is the patient insulin dependent?    Answer:  No    Order Specific Question:  Please enter gestational age. This should be expressed as weeks AND days, i.e. 16w 6d. Enter weeks here. Enter days in next question.    Answer:  56    Order Specific Question:  Please enter gestational age. This should be expressed as weeks AND days, i.e. 16w 6d. Enter days here. Enter weeks in previous question.    Answer:  1    Order Specific Question:  How was gestational age calculated?    Answer:  Ultrasound    Order Specific Question:  Please give the date of LMP OR Ultrasound OR Estimated date of delivery.    Answer:  05/19/2016    Order Specific Question:  Number of Fetuses (Type of  Pregnancy):    Answer:  1    Order Specific Question:  Indications for performing the test? (please choose all that apply):    Answer:  Routine screening    Order Specific Question:  Other Indications? (Y=Yes, N=No)    Answer:  Y    Order Specific Question:  Please specify other indications, if any:    Answer:  hx of multiple SABs    Order Specific Question:  If this is a repeat specimen, please indicate the reason:    Answer:  Not indicated    Order Specific Question:  Please specify the patient's race: (C=White/Caucasion, B=Black, I=Native American, A=Asian, H=Hispanic, O=Other, U=Unknown)    Answer:  B    Order Specific Question:  Donor Egg - indicate if the egg was obtained from in vitro fertilization.    Answer:  N    Order Specific Question:  Age of Egg Donor.    Answer:  73    Order Specific Question:  Prior Down Syndrome/ONTD screening during current pregnancy.    Answer:  N    Order Specific Question:  Prior First Trimester Testing    Answer:  N    Order Specific Question:  Prior Second Trimester Testing    Answer:  N    Order Specific Question:  Family History of Neural Tube Defects    Answer:  N    Order Specific Question:  Prior Pregnancy with Down Syndrome    Answer:  N    Order Specific Question:  Please give the patient's weight (in pounds)    Answer:  187  . Beta HCG, Quant  . POCT Urinalysis Dipstick    Follow up in 4 weeks. 50% of 30 min visit spent on counseling and coordination of care.

## 2015-10-19 LAB — TSH: TSH: 0.624 u[IU]/mL (ref 0.450–4.500)

## 2015-10-19 LAB — PRENATAL PROFILE I(LABCORP)
ANTIBODY SCREEN: NEGATIVE
BASOS: 1 %
Basophils Absolute: 0 10*3/uL (ref 0.0–0.2)
EOS (ABSOLUTE): 0.1 10*3/uL (ref 0.0–0.4)
EOS: 2 %
HEMATOCRIT: 41 % (ref 34.0–46.6)
HEP B S AG: NEGATIVE
Hemoglobin: 12.8 g/dL (ref 11.1–15.9)
IMMATURE GRANS (ABS): 0 10*3/uL (ref 0.0–0.1)
Immature Granulocytes: 0 %
LYMPHS: 39 %
Lymphocytes Absolute: 1.5 10*3/uL (ref 0.7–3.1)
MCH: 23.2 pg — ABNORMAL LOW (ref 26.6–33.0)
MCHC: 31.2 g/dL — ABNORMAL LOW (ref 31.5–35.7)
MCV: 74 fL — AB (ref 79–97)
MONOCYTES: 11 %
Monocytes Absolute: 0.5 10*3/uL (ref 0.1–0.9)
NEUTROS ABS: 1.9 10*3/uL (ref 1.4–7.0)
Neutrophils: 47 %
Platelets: 232 10*3/uL (ref 150–379)
RBC: 5.52 x10E6/uL — ABNORMAL HIGH (ref 3.77–5.28)
RDW: 15.2 % (ref 12.3–15.4)
RH TYPE: POSITIVE
RPR: NONREACTIVE
RUBELLA: 1.52 {index} (ref >0.99)
WBC: 4 10*3/uL (ref 3.4–10.8)

## 2015-10-19 LAB — HEMOGLOBINOPATHY EVALUATION
HEMOGLOBIN A2 QUANTITATION: 4.2 % — AB (ref 0.7–3.1)
HEMOGLOBIN F QUANTITATION: 0 % (ref 0.0–2.0)
HGB A: 68.4 % — AB (ref 94.0–98.0)
HGB C: 0 %
HGB S: 27.4 % — ABNORMAL HIGH

## 2015-10-19 LAB — VARICELLA ZOSTER ANTIBODY, IGG: Varicella zoster IgG: 484 index (ref >165)

## 2015-10-19 LAB — BETA HCG QUANT (REF LAB): HCG QUANT: 13002 m[IU]/mL

## 2015-10-19 LAB — HIV ANTIBODY (ROUTINE TESTING W REFLEX): HIV Screen 4th Generation wRfx: NONREACTIVE

## 2015-10-23 ENCOUNTER — Other Ambulatory Visit: Payer: Self-pay | Admitting: Certified Nurse Midwife

## 2015-10-23 DIAGNOSIS — O2341 Unspecified infection of urinary tract in pregnancy, first trimester: Secondary | ICD-10-CM

## 2015-10-23 MED ORDER — NITROFURANTOIN MONOHYD MACRO 100 MG PO CAPS: 100.0000 mg | ORAL_CAPSULE | Freq: Two times a day (BID) | ORAL | Status: AC

## 2015-10-25 ENCOUNTER — Encounter (HOSPITAL_COMMUNITY): Payer: Self-pay

## 2015-10-25 ENCOUNTER — Other Ambulatory Visit: Payer: Self-pay | Admitting: Certified Nurse Midwife

## 2015-10-25 ENCOUNTER — Inpatient Hospital Stay (HOSPITAL_COMMUNITY): Payer: Medicaid Other

## 2015-10-25 ENCOUNTER — Inpatient Hospital Stay (HOSPITAL_COMMUNITY)
Admission: AD | Admit: 2015-10-25 | Discharge: 2015-10-25 | Disposition: A | Discharge status: Home / Self Care | Payer: Medicaid Other | Source: Ambulatory Visit | Attending: Obstetrics | Admitting: Obstetrics

## 2015-10-25 DIAGNOSIS — O039 Complete or unspecified spontaneous abortion without complication: Secondary | ICD-10-CM | POA: Diagnosis not present

## 2015-10-25 DIAGNOSIS — Z87891 Personal history of nicotine dependence: Secondary | ICD-10-CM | POA: Diagnosis not present

## 2015-10-25 DIAGNOSIS — Z3A11 11 weeks gestation of pregnancy: Secondary | ICD-10-CM | POA: Diagnosis not present

## 2015-10-25 DIAGNOSIS — O200 Threatened abortion: Secondary | ICD-10-CM | POA: Diagnosis present

## 2015-10-25 DIAGNOSIS — O209 Hemorrhage in early pregnancy, unspecified: Secondary | ICD-10-CM

## 2015-10-25 LAB — WET PREP, GENITAL
CLUE CELLS WET PREP: NONE SEEN
Sperm: NONE SEEN
TRICH WET PREP: NONE SEEN
Yeast Wet Prep HPF POC: NONE SEEN

## 2015-10-25 LAB — MATERNIT21 PLUS CORE+SCA
CHROMOSOME 18: NEGATIVE
CHROMOSOME 21: NEGATIVE
Chromosome 13: NEGATIVE
PDF: 0
Y CHROMOSOME: NOT DETECTED

## 2015-10-25 LAB — URINALYSIS, ROUTINE W REFLEX MICROSCOPIC
Bilirubin Urine: NEGATIVE
GLUCOSE, UA: NEGATIVE mg/dL
KETONES UR: NEGATIVE mg/dL
LEUKOCYTES UA: NEGATIVE
NITRITE: NEGATIVE
PROTEIN: NEGATIVE mg/dL
Specific Gravity, Urine: 1.015 (ref 1.005–1.030)
pH: 6 (ref 5.0–8.0)

## 2015-10-25 LAB — URINE MICROSCOPIC-ADD ON: RBC / HPF: NONE SEEN RBC/hpf (ref 0–5)

## 2015-10-25 LAB — CULTURE, OB URINE

## 2015-10-25 LAB — URINE CULTURE, OB REFLEX

## 2015-10-25 MED ORDER — OXYCODONE-ACETAMINOPHEN 5-325 MG PO TABS: 1.0000 | ORAL_TABLET | Freq: Four times a day (QID) | ORAL | Status: DC | PRN

## 2015-10-25 MED ORDER — MISOPROSTOL 200 MCG PO TABS
600.0000 ug | ORAL_TABLET | Freq: Once | ORAL | Status: AC
  Administered 2015-10-25: 600 ug via ORAL
  Filled 2015-10-25: qty 3

## 2015-10-25 MED ORDER — MISOPROSTOL 200 MCG PO TABS: ORAL_TABLET | ORAL | Status: DC

## 2015-10-25 MED ORDER — PROMETHAZINE HCL 25 MG PO TABS: 25.0000 mg | ORAL_TABLET | Freq: Four times a day (QID) | ORAL | Status: DC | PRN

## 2015-10-25 NOTE — MAU Note (Signed)
Yesterday she started cramping, pinkish then dk reddish brown d/c;  Has changed 2 pantiliners today, did mess up bed last night.  Seen a few small clots when she wiped.  Normal rise in BHCG in office.

## 2015-10-25 NOTE — MAU Note (Signed)
Urine in lab 

## 2015-10-25 NOTE — Discharge Instructions (Signed)
Miscarriage °A miscarriage is the loss of an unborn baby (fetus) before the 20th week of pregnancy. The cause is often unknown.  °HOME CARE °· You may need to stay in bed (bed rest), or you may be able to do light activity. Go about activity as told by your doctor. °· Have help at home. °· Write down how many pads you use each day. Write down how soaked they are. °· Do not use tampons. Do not wash out your vagina (douche) or have sex (intercourse) until your doctor approves. °· Only take medicine as told by your doctor. °· Do not take aspirin. °· Keep all doctor visits as told. °· If you or your partner have problems with grieving, talk to your doctor. You can also try counseling. Give yourself time to grieve before trying to get pregnant again. °GET HELP RIGHT AWAY IF: °· You have bad cramps or pain in your back or belly (abdomen). °· You have a fever. °· You pass large clumps of blood (clots) from your vagina that are walnut-sized or larger. Save the clumps for your doctor to see. °· You pass large amounts of tissue from your vagina. Save the tissue for your doctor to see. °· You have more bleeding. °· You have thick, bad-smelling fluid (discharge) coming from the vagina. °· You get lightheaded, weak, or you pass out (faint). °· You have chills. °MAKE SURE YOU: °· Understand these instructions. °· Will watch your condition. °· Will get help right away if you are not doing well or get worse. °  °This information is not intended to replace advice given to you by your health care provider. Make sure you discuss any questions you have with your health care provider. °  °Document Released: 08/13/2011 Document Reviewed: 08/13/2011 °Elsevier Interactive Patient Education ©2016 Elsevier Inc[employer] ° °

## 2015-10-25 NOTE — MAU Provider Note (Signed)
MAU HISTORY AND PHYSICAL  Chief Complaint:  Threatened Miscarriage   Christina Greene is a 35 y.o.  [id number] with IUP at 6028w1d presenting for Threatened Miscarriage  IUP with cardiac activity seen on 5/4 u/s. Had bleeding in April and u/s did show subchorionic hematoma.  For past day has noticed small amount of red blood and small clots. Some right mild pelvic cramping. Otherwise feeling well, no fevers or dysuria, no vomiting.      Past Medical History  Diagnosis Date  . Ectopic pregnancy   . Ovarian cyst   . Abnormal Pap smear     colpo ok  . Depression     doing ok    Past Surgical History  Procedure Laterality Date  . Dilation and curettage of uterus    . Laparotomy      ruptured ectopic, rt tube; ovarian cyst  . Wisdom tooth extraction    . Nasal fracture surgery  2002  . Hand surgery Right   . Laparoscopic appendectomy N/A 10/04/2013    Procedure: APPENDECTOMY LAPAROSCOPIC;  Surgeon: Adolph Pollackodd J Rosenbower, MD;  Location: [facility];  Service: General;  Laterality: N/A;  . Appendectomy      Family History  Problem Relation Age of Onset  . Hypertension Mother   . Hypothyroidism Mother   . Diabetes Father   . Liver cancer Father   . Hepatitis Father   . Other Neg Hx     Social History  Substance Use Topics  . Smoking status: Former Smoker -- 4 years    Types: Cigars  . Smokeless tobacco: Never Used     Comment: black and milds  . Alcohol Use: 0.0 oz/week    0 Standard drinks or equivalent per week     Comment: occ    No Known Allergies  Prescriptions prior to admission  Medication Sig Dispense Refill Last Dose  . cyclobenzaprine (FLEXERIL) 5 MG tablet Take 5 mg by mouth 3 (three) times daily as needed for muscle spasms.   10/24/2015 at Unknown time  . docusate sodium (COLACE) 100 MG capsule Take 2 capsules (200 mg total) by mouth at bedtime. 120 capsule 1 10/24/2015 at Unknown time  . metroNIDAZOLE (METROGEL VAGINAL) 0.75 % vaginal gel Place 1 Applicatorful  vaginally 2 (two) times daily. (Patient taking differently: Place 1 Applicatorful vaginally at bedtime as needed (when irritated). ) 70 g 0 2 weeks ago at Unknown time  . Prenatal Vit-Fe Phos-FA-Omega (VITAFOL GUMMIES) 3.33-0.333-34.8 MG CHEW Chew 3 tablets by mouth daily. 90 tablet 12 10/25/2015 at Unknown time  . terconazole (TERAZOL 7) 0.4 % vaginal cream Place 1 applicator vaginally at bedtime. (Patient taking differently: Place 1 applicator vaginally 3 times/day as needed-between meals & bedtime (when irritated). ) 45 g 0 2 weeks ago  . nitrofurantoin, macrocrystal-monohydrate, (MACROBID) 100 MG capsule Take 1 capsule (100 mg total) by mouth 2 (two) times daily. (Patient not taking: Reported on 10/25/2015) 14 capsule 0 Not Taking at Unknown time    Review of Systems - Negative except for what is mentioned in HPI.  Physical Exam  Blood pressure 126/78, pulse 89, temperature 98.8 F (37.1 C), temperature source Oral, resp. rate 16, height 5\' 6"  (1.676 m), weight 189 lb 3.2 oz (85.821 kg), last menstrual period 08/08/2015. GENERAL: Well-developed, well-nourished female in no acute distress.  LUNGS: Clear to auscultation bilaterally.  HEART: Regular rate and rhythm. ABDOMEN: Soft, nontender, nondistended, gravid.  EXTREMITIES: Nontender, no edema, 2+ distal pulses. GU: normal anus, small/mod amount  brb and dark brown blood in vagina, cervix visually closed   Labs: Results for orders placed or performed during the hospital encounter of 10/25/15 (from the past 24 hour(s))  Urinalysis, Routine w reflex microscopic (not at [facility])   Collection Time: 10/25/15  1:12 PM  Result Value Ref Range   Color, Urine YELLOW YELLOW   APPearance CLEAR CLEAR   Specific Gravity, Urine 1.015 1.005 - 1.030   pH 6.0 5.0 - 8.0   Glucose, UA NEGATIVE NEGATIVE mg/dL   Hgb urine dipstick LARGE (A) NEGATIVE   Bilirubin Urine NEGATIVE NEGATIVE   Ketones, ur NEGATIVE NEGATIVE mg/dL   Protein, ur NEGATIVE NEGATIVE  mg/dL   Nitrite NEGATIVE NEGATIVE   Leukocytes, UA NEGATIVE NEGATIVE  Urine microscopic-add on   Collection Time: 10/25/15  1:12 PM  Result Value Ref Range   Squamous Epithelial / LPF 6-30 (A) NONE SEEN   WBC, UA 0-5 0 - 5 WBC/hpf   RBC / HPF NONE SEEN 0 - 5 RBC/hpf   Bacteria, UA MANY (A) NONE SEEN   Crystals CA OXALATE CRYSTALS (A) NEGATIVE  Wet prep, genital   Collection Time: 10/25/15  4:47 PM  Result Value Ref Range   Yeast Wet Prep HPF POC NONE SEEN NONE SEEN   Trich, Wet Prep NONE SEEN NONE SEEN   Clue Cells Wet Prep HPF POC NONE SEEN NONE SEEN   WBC, Wet Prep HPF POC MANY (A) NONE SEEN   Sperm NONE SEEN     Imaging Studies:  US Ob Comp Less 14 Wks  10/25/2015  CLINICAL DATA:  Continued vaginal bleeding EXAM: OBSTETRIC <14 WK [place] AND TRANSVAGINAL OB US TECHNIQUE: Both transabdominal and transvaginal ultrasound examinations were performed for complete evaluation of the gestation as well as the maternal uterus, adnexal regions, and pelvic cul-de-sac. Transvaginal technique was performed to assess early pregnancy. COMPARISON:  10/06/2015 FINDINGS: Intrauterine gestational sac: Present Yolk sac:  Absent Embryo:  Present Cardiac Activity: Absent Subchorionic hemorrhage: Subchorionic hemorrhage previously seen has increased in size somewhat. Maternal uterus/adnexae: No acute abnormality is noted. Involuting corpus luteum cyst on the left is noted. IMPRESSION: Changes consistent with fetal demise with absent cardiac activity. There is also been no significant progression in size from the exam almost 3 weeks previous. Electronically Signed   By: Alcide Clever M.D.   On: 10/25/2015 17:04   US Ob Transvaginal  10/25/2015  CLINICAL DATA:  Continued vaginal bleeding EXAM: OBSTETRIC <14 WK [place] AND TRANSVAGINAL OB US TECHNIQUE: Both transabdominal and transvaginal ultrasound examinations were performed for complete evaluation of the gestation as well as the maternal uterus, adnexal regions, and  pelvic cul-de-sac. Transvaginal technique was performed to assess early pregnancy. COMPARISON:  10/06/2015 FINDINGS: Intrauterine gestational sac: Present Yolk sac:  Absent Embryo:  Present Cardiac Activity: Absent Subchorionic hemorrhage: Subchorionic hemorrhage previously seen has increased in size somewhat. Maternal uterus/adnexae: No acute abnormality is noted. Involuting corpus luteum cyst on the left is noted. IMPRESSION: Changes consistent with fetal demise with absent cardiac activity. There is also been no significant progression in size from the exam almost 3 weeks previous. Electronically Signed   By: Alcide Clever M.D.   On: 10/25/2015 17:04   US Ob Transvaginal  10/06/2015  CLINICAL DATA:  Intermittent vaginal bleeding in first trimester pregnancy. First trimester pregnancy with inconclusive fetal viability. Gestational age by LMP of 8 weeks 3 days. EXAM: TRANSVAGINAL OB ULTRASOUND TECHNIQUE: Transvaginal ultrasound was performed for complete evaluation of the gestation as well as the  maternal uterus, adnexal regions, and pelvic cul-de-sac. COMPARISON:  09/15/2015 FINDINGS: Intrauterine gestational sac: Single Yolk sac:  Visualized Embryo:  Visualized Cardiac Activity: Visualized Heart Rate: 144 bpm CRL:   14  mm   7 w 5 d                  [place] EDC: 05/19/2016 Subchorionic hemorrhage:  Small subchorionic hemorrhage noted. Maternal uterus/adnexae: Retroverted uterus pre both ovaries are normal in appearance. No mass or free fluid identified. IMPRESSION: Single living IUP measuring 7 weeks 5 days with [place] EDC of 05/19/2016. This is concordant with LMP. Small subchorionic hemorrhage noted. Electronically Signed   By: Myles Rosenthal M.D.   On: 10/06/2015 09:44    Assessment: TOULA MIYASAKI is  35 y.o. [id number] at [redacted]w[redacted]d presents with SAB confirmed with u/s. Bleeding is mild, patient is hemodynamically stable, no s/s infection. Wet prep unremarkable, ua not suggestive of infection, g/c pending. Rh positive.  Discussed w/ Dr. Clearance Coots.  Plan: - cytotec 600 po now and again in 12 hours if products not passed - phenergan and percocet for symptom control - bleeding/infection return precautions - ob f/u this week if does not appear pregnancy has passed, otherwise in 2 weeks  Silvano Bilis 5/23/20175:16 PM

## 2015-10-26 LAB — GC/CHLAMYDIA PROBE AMP (~~LOC~~) NOT AT ARMC
CHLAMYDIA, DNA PROBE: NEGATIVE
Neisseria Gonorrhea: NEGATIVE

## 2015-11-15 ENCOUNTER — Ambulatory Visit (INDEPENDENT_AMBULATORY_CARE_PROVIDER_SITE_OTHER): Payer: Medicaid Other | Admitting: Obstetrics

## 2015-11-15 ENCOUNTER — Encounter: Payer: Medicaid Other | Admitting: Obstetrics

## 2015-11-15 VITALS — BP 125/88 | HR 66 | Wt 185.0 lb

## 2015-11-15 DIAGNOSIS — Z3009 Encounter for other general counseling and advice on contraception: Secondary | ICD-10-CM | POA: Diagnosis not present

## 2015-11-15 DIAGNOSIS — O3680X1 Pregnancy with inconclusive fetal viability, fetus 1: Secondary | ICD-10-CM

## 2015-11-15 NOTE — Patient Instructions (Signed)
Miscarriage  A miscarriage is the sudden loss of an unborn baby (fetus) before the 20th week of pregnancy. Most miscarriages happen in the first 3 months of pregnancy. Sometimes, it happens before a woman even knows she is pregnant. A miscarriage is also called a "spontaneous miscarriage" or "early pregnancy loss." Having a miscarriage can be an emotional experience. Talk with your caregiver about any questions you may have about miscarrying, the grieving process, and your future pregnancy plans.  CAUSES    Problems with the fetal chromosomes that make it impossible for the baby to develop normally. Problems with the baby's genes or chromosomes are most often the result of errors that occur, by chance, as the embryo divides and grows. The problems are not inherited from the parents.   Infection of the cervix or uterus.    Hormone problems.    Problems with the cervix, such as having an incompetent cervix. This is when the tissue in the cervix is not strong enough to hold the pregnancy.    Problems with the uterus, such as an abnormally shaped uterus, uterine fibroids, or congenital abnormalities.    Certain medical conditions.    Smoking, drinking alcohol, or taking illegal drugs.    Trauma.   Often, the cause of a miscarriage is unknown.   SYMPTOMS    Vaginal bleeding or spotting, with or without cramps or pain.   Pain or cramping in the abdomen or lower back.   Passing fluid, tissue, or blood clots from the vagina.  DIAGNOSIS   Your caregiver will perform a physical exam. You may also have an ultrasound to confirm the miscarriage. Blood or urine tests may also be ordered.  TREATMENT    Sometimes, treatment is not necessary if you naturally pass all the fetal tissue that was in the uterus. If some of the fetus or placenta remains in the body (incomplete miscarriage), tissue left behind may become infected and must be removed. Usually, a dilation and curettage (D and C) procedure is performed.  During a D and C procedure, the cervix is widened (dilated) and any remaining fetal or placental tissue is gently removed from the uterus.   Antibiotic medicines are prescribed if there is an infection. Other medicines may be given to reduce the size of the uterus (contract) if there is a lot of bleeding.   If you have Rh negative blood and your baby was Rh positive, you will need a Rh immunoglobulin shot. This shot will protect any future baby from having Rh blood problems in future pregnancies.  HOME CARE INSTRUCTIONS    Your caregiver may order bed rest or may allow you to continue light activity. Resume activity as directed by your caregiver.   Have someone help with home and family responsibilities during this time.    Keep track of the number of sanitary pads you use each day and how soaked (saturated) they are. Write down this information.    Do not use tampons. Do not douche or have sexual intercourse until approved by your caregiver.    Only take over-the-counter or prescription medicines for pain or discomfort as directed by your caregiver.    Do not take aspirin. Aspirin can cause bleeding.    Keep all follow-up appointments with your caregiver.    If you or your partner have problems with grieving, talk to your caregiver or seek counseling to help cope with the pregnancy loss. Allow enough time to grieve before trying to get pregnant again.     SEEK IMMEDIATE MEDICAL CARE IF:    You have severe cramps or pain in your back or abdomen.   You have a fever.   You pass large blood clots (walnut-sized or larger) ortissue from your vagina. Save any tissue for your caregiver to inspect.    Your bleeding increases.    You have a thick, bad-smelling vaginal discharge.   You become lightheaded, weak, or you faint.    You have chills.   MAKE SURE YOU:   Understand these instructions.   Will watch your condition.   Will get help right away if you are not doing well or get worse.     This  information is not intended to replace advice given to you by your health care provider. Make sure you discuss any questions you have with your health care provider.     Document Released: 11/14/2000 Document Revised: 09/15/2012 Document Reviewed: 07/10/2011  Elsevier Interactive Patient Education 2016 [employer]

## 2015-11-16 ENCOUNTER — Encounter: Payer: Self-pay | Admitting: Obstetrics

## 2015-11-16 LAB — BETA HCG QUANT (REF LAB): hCG Quant: 43 m[IU]/mL

## 2015-11-16 NOTE — Progress Notes (Signed)
Patient ID: Christina Greene, female   DOB: [birth year], 35 y.o.   MRN: [id number]  Chief Complaint  Patient presents with  . Follow-up    hosptial visit on 10-25-15    HPI Christina Greene is a 35 y.o. female.  Presents for check up after SAB.  No complaints.  HPI  Past Medical History  Diagnosis Date  . Ectopic pregnancy   . Ovarian cyst   . Abnormal Pap smear     colpo ok  . Depression     doing ok    Past Surgical History  Procedure Laterality Date  . Dilation and curettage of uterus    . Laparotomy      ruptured ectopic, rt tube; ovarian cyst  . Wisdom tooth extraction    . Nasal fracture surgery  2002  . Hand surgery Right   . Laparoscopic appendectomy N/A 10/04/2013    Procedure: APPENDECTOMY LAPAROSCOPIC;  Surgeon: Adolph Pollack, MD;  Location: [facility];  Service: General;  Laterality: N/A;  . Appendectomy      Family History  Problem Relation Age of Onset  . Hypertension Mother   . Hypothyroidism Mother   . Diabetes Father   . Liver cancer Father   . Hepatitis Father   . Other Neg Hx     Social History Social History  Substance Use Topics  . Smoking status: Former Smoker -- 4 years    Types: Cigars  . Smokeless tobacco: Never Used     Comment: black and milds  . Alcohol Use: 0.0 oz/week    0 Standard drinks or equivalent per week     Comment: occ    No Known Allergies  Current Outpatient Prescriptions  Medication Sig Dispense Refill  . cyclobenzaprine (FLEXERIL) 5 MG tablet Take 5 mg by mouth 3 (three) times daily as needed for muscle spasms.    Marland Kitchen docusate sodium (COLACE) 100 MG capsule Take 2 capsules (200 mg total) by mouth at bedtime. 120 capsule 1  . misoprostol (CYTOTEC) 200 MCG tablet Take all three tablets 12 hours after the initial hospital dose as instructed 1 tablet o  . oxyCODONE-acetaminophen (PERCOCET/ROXICET) 5-325 MG tablet Take 1 tablet by mouth every 6 (six) hours as needed. 8 tablet 0  . promethazine (PHENERGAN) 25 MG tablet  Take 1 tablet (25 mg total) by mouth every 6 (six) hours as needed for nausea or vomiting. 30 tablet 2  . terconazole (TERAZOL 7) 0.4 % vaginal cream Place 1 applicator vaginally at bedtime. (Patient taking differently: Place 1 applicator vaginally 3 times/day as needed-between meals & bedtime (when irritated). ) 45 g 0   No current facility-administered medications for this visit.    Review of Systems Review of Systems Constitutional: negative for fatigue and weight loss Respiratory: negative for cough and wheezing Cardiovascular: negative for chest pain, fatigue and palpitations Gastrointestinal: negative for abdominal pain and change in bowel habits Genitourinary:negative Integument/breast: negative for nipple discharge Musculoskeletal:negative for myalgias Neurological: negative for gait problems and tremors Behavioral/Psych: negative for abusive relationship, depression Endocrine: negative for temperature intolerance     Blood pressure 125/88, pulse 66, weight 185 lb (83.915 kg), last menstrual period 08/08/2015.  Physical Exam Physical Exam General:   alert  Skin:   no rash or abnormalities  Lungs:   clear to auscultation bilaterally  Heart:   regular rate and rhythm, S1, S2 normal, no murmur, click, rub or gallop  Breasts:   normal without suspicious masses, skin or nipple changes or  axillary nodes  Abdomen:  normal findings: no organomegaly, soft, non-tender and no hernia  Pelvis:  External genitalia: normal general appearance Urinary system: urethral meatus normal and bladder without fullness, nontender Vaginal: normal without tenderness, induration or masses Cervix: normal appearance Adnexa: normal bimanual exam Uterus: anteverted and non-tender, normal size      Data Reviewed Labs  Assessment     S/P complotted SAB.  Doing well.. Considering contraceptive options     Plan    Educational material dispensed F/U in 4 weeks.   Orders Placed This Encounter   Procedures  . Beta HCG, Quant   No orders of the defined types were placed in this encounter.

## 2015-11-30 ENCOUNTER — Telehealth (HOSPITAL_COMMUNITY): Payer: Self-pay | Admitting: *Deleted

## 2015-12-01 ENCOUNTER — Ambulatory Visit: Payer: Medicaid Other | Admitting: Certified Nurse Midwife

## 2015-12-05 ENCOUNTER — Encounter (HOSPITAL_COMMUNITY): Payer: Self-pay

## 2015-12-05 ENCOUNTER — Telehealth: Payer: Self-pay | Admitting: Medical

## 2015-12-05 ENCOUNTER — Inpatient Hospital Stay (HOSPITAL_COMMUNITY)
Admission: AD | Admit: 2015-12-05 | Discharge: 2015-12-05 | Disposition: A | Discharge status: Home / Self Care | Payer: Medicaid Other | Source: Ambulatory Visit | Attending: Obstetrics & Gynecology | Admitting: Obstetrics & Gynecology

## 2015-12-05 DIAGNOSIS — O039 Complete or unspecified spontaneous abortion without complication: Secondary | ICD-10-CM | POA: Diagnosis not present

## 2015-12-05 DIAGNOSIS — N939 Abnormal uterine and vaginal bleeding, unspecified: Secondary | ICD-10-CM | POA: Diagnosis present

## 2015-12-05 DIAGNOSIS — Z87891 Personal history of nicotine dependence: Secondary | ICD-10-CM | POA: Diagnosis not present

## 2015-12-05 LAB — GC/CHLAMYDIA PROBE AMP (~~LOC~~) NOT AT ARMC
CHLAMYDIA, DNA PROBE: NEGATIVE
NEISSERIA GONORRHEA: NEGATIVE

## 2015-12-05 LAB — URINALYSIS, ROUTINE W REFLEX MICROSCOPIC
BILIRUBIN URINE: NEGATIVE
GLUCOSE, UA: NEGATIVE mg/dL
KETONES UR: NEGATIVE mg/dL
Leukocytes, UA: NEGATIVE
Nitrite: NEGATIVE
PROTEIN: 30 mg/dL — AB
Specific Gravity, Urine: >1.03 — ABNORMAL HIGH (ref 1.005–1.030)
pH: 6 (ref 5.0–8.0)

## 2015-12-05 LAB — HCG, QUANTITATIVE, PREGNANCY: hCG, Beta Chain, Quant, S: 1 m[IU]/mL (ref <5)

## 2015-12-05 LAB — URINE MICROSCOPIC-ADD ON

## 2015-12-05 LAB — WET PREP, GENITAL
CLUE CELLS WET PREP: NONE SEEN
Sperm: NONE SEEN
Trich, Wet Prep: NONE SEEN
Yeast Wet Prep HPF POC: NONE SEEN

## 2015-12-05 LAB — HIV ANTIBODY (ROUTINE TESTING W REFLEX): HIV Screen 4th Generation wRfx: NONREACTIVE

## 2015-12-05 LAB — RPR: RPR: NONREACTIVE

## 2015-12-05 NOTE — MAU Provider Note (Signed)
History     CSN: [id number]  Arrival date and time: 12/05/15 0355   First Provider Initiated Contact with Patient 12/05/15 0503      Chief Complaint  Patient presents with  . Vaginal Bleeding   HPI [name] is a 35 y.o. [phone] who presents to MAU today with complaint of continued bleeding and vaginal odor. The patient was diagnosed with SAB a few weeks ago and had follow-up with [facility] on 11/15/15. Quant hCG at that time was 43. Patient states continued brown bleeding. She is required to change a panty liner up to 5 times/day. She denies any heavy or bright red bleeding recently. She has recently noted a vaginal odor and tried Metrogel, however she was told by the triage RN over the phone to discontinue this until she was tested. She denies abdominal pain or fever. She has had intercourse since her last visit ~ 2 weeks ago, unprotected.   OB History    Gravida Para Term Preterm AB TAB SAB Ectopic Multiple Living   5 1 1  0 4 0 3 1 0 1      Past Medical History  Diagnosis Date  . Ectopic pregnancy   . Ovarian cyst   . Abnormal Pap smear     colpo ok  . Depression     doing ok    Past Surgical History  Procedure Laterality Date  . Dilation and curettage of uterus    . Laparotomy      ruptured ectopic, rt tube; ovarian cyst  . Wisdom tooth extraction    . Nasal fracture surgery  2002  . Hand surgery Right   . Laparoscopic appendectomy N/A 10/04/2013    Procedure: APPENDECTOMY LAPAROSCOPIC;  Surgeon: Adolph Pollackodd J Rosenbower, MD;  Location: [facility];  Service: General;  Laterality: N/A;  . Appendectomy      Family History  Problem Relation Age of Onset  . Hypertension Mother   . Hypothyroidism Mother   . Diabetes Father   . Liver cancer Father   . Hepatitis Father   . Other Neg Hx     Social History  Substance Use Topics  . Smoking status: Former Smoker -- 4 years    Types: Cigars  . Smokeless tobacco: Never Used     Comment: black and milds  . Alcohol Use:  0.0 oz/week    0 Standard drinks or equivalent per week     Comment: occ    Allergies: No Known Allergies  No prescriptions prior to admission    Review of Systems  Constitutional: Negative for fever and malaise/fatigue.  Gastrointestinal: Negative for nausea, vomiting, abdominal pain, diarrhea and constipation.  Genitourinary: Negative for dysuria, urgency and frequency.       + spotting Neg - vaginal discharge   Physical Exam   Blood pressure 137/79, pulse 72, temperature 98.1 F (36.7 C), temperature source Oral, resp. rate 18, last menstrual period 08/08/2015, unknown if currently breastfeeding.  Physical Exam  Nursing note and vitals reviewed. Constitutional: She is oriented to person, place, and time. She appears well-developed and well-nourished. No distress.  HENT:  Head: Normocephalic and atraumatic.  Cardiovascular: Normal rate.   Respiratory: Effort normal.  GI: Soft. She exhibits no distension and no mass. There is no tenderness. There is no rebound and no guarding.  Genitourinary: Uterus is not enlarged and not tender. Cervix exhibits no motion tenderness, no discharge and no friability. Right adnexum displays no mass and no tenderness. Left adnexum displays no  mass and no tenderness. There is bleeding (small brown blood noted in the vaginal vault) in the vagina. No vaginal discharge found.  Neurological: She is alert and oriented to person, place, and time.  Skin: Skin is warm and dry. No erythema.  Psychiatric: She has a normal mood and affect.    Results for orders placed or performed during the hospital encounter of 12/05/15 (from the past 24 hour(s))  Urinalysis, Routine w reflex microscopic (not at [facility])     Status: Abnormal   Collection Time: 12/05/15  4:25 AM  Result Value Ref Range   Color, Urine YELLOW YELLOW   APPearance CLEAR CLEAR   Specific Gravity, Urine >1.030 (H) 1.005 - 1.030   pH 6.0 5.0 - 8.0   Glucose, UA NEGATIVE NEGATIVE mg/dL   Hgb  urine dipstick LARGE (A) NEGATIVE   Bilirubin Urine NEGATIVE NEGATIVE   Ketones, ur NEGATIVE NEGATIVE mg/dL   Protein, ur 30 (A) NEGATIVE mg/dL   Nitrite NEGATIVE NEGATIVE   Leukocytes, UA NEGATIVE NEGATIVE  Urine microscopic-add on     Status: Abnormal   Collection Time: 12/05/15  4:25 AM  Result Value Ref Range   Squamous Epithelial / LPF 0-5 (A) NONE SEEN   WBC, UA 0-5 0 - 5 WBC/hpf   RBC / HPF 0-5 0 - 5 RBC/hpf   Bacteria, UA RARE (A) NONE SEEN  Wet prep, genital     Status: Abnormal   Collection Time: 12/05/15  5:10 AM  Result Value Ref Range   Yeast Wet Prep HPF POC NONE SEEN NONE SEEN   Trich, Wet Prep NONE SEEN NONE SEEN   Clue Cells Wet Prep HPF POC NONE SEEN NONE SEEN   WBC, Wet Prep HPF POC MODERATE (A) NONE SEEN   Sperm NONE SEEN   hCG, quantitative, pregnancy     Status: None   Collection Time: 12/05/15  5:21 AM  Result Value Ref Range   hCG, Beta Chain, Quant, S 1 <5 mIU/mL    MAU Course  Procedures None  MDM UA, quant hCG, wet prep, GC/Chlamydia, HIV and RPR today  Wet prep negative, GC/Chlamydia pending Patient will be contacted with HCG results. LM for patient to return call to MAU for results.  Assessment and Plan  A: Recent SAB, complete Vaginal bleeding  Vaginal odor   P: Discharge home Bleeding precautions discussed GC/Chlamydia pending Patient advised to follow-up with [facility] as needed for routine GYN care as scheduled this week Patient may return to MAU as needed or if her condition were to change or worsen   Marny LowensteinJulie N Yoceline Bazar, PA-C  12/05/2015, 7:11 AM

## 2015-12-05 NOTE — Telephone Encounter (Signed)
Patient returned call. Advised of normal hCG levels. Advised to keep appointment with [facility] as discussed earlier this morning. Patient voiced understanding.   Marny LowensteinJulie N Vahan Wadsworth, PA-C 12/05/2015 7:19 AM

## 2015-12-05 NOTE — Discharge Instructions (Signed)
Incomplete Miscarriage A miscarriage is the sudden loss of an unborn baby (fetus) before the 20th week of pregnancy. In an incomplete miscarriage, parts of the fetus or placenta (afterbirth) remain in the body.  Having a miscarriage can be an emotional experience. Talk with your health care provider about any questions you may have about miscarrying, the grieving process, and your future pregnancy plans. CAUSES   Problems with the fetal chromosomes that make it impossible for the baby to develop normally. Problems with the baby's genes or chromosomes are most often the result of errors that occur by chance as the embryo divides and grows. The problems are not inherited from the parents.  Infection of the cervix or uterus.  Hormone problems.  Problems with the cervix, such as having an incompetent cervix. This is when the tissue in the cervix is not strong enough to hold the pregnancy.  Problems with the uterus, such as an abnormally shaped uterus, uterine fibroids, or congenital abnormalities.  Certain medical conditions.  Smoking, drinking alcohol, or taking illegal drugs.  Trauma. SYMPTOMS   Vaginal bleeding or spotting, with or without cramps or pain.  Pain or cramping in the abdomen or lower back.  Passing fluid, tissue, or blood clots from the vagina. DIAGNOSIS  Your health care provider will perform a physical exam. You may also have an ultrasound to confirm the miscarriage. Blood or urine tests may also be ordered. TREATMENT   Usually, a dilation and curettage (D&C) procedure is performed. During a D&C procedure, the cervix is widened (dilated) and any remaining fetal or placental tissue is gently removed from the uterus.  Antibiotic medicines are prescribed if there is an infection. Other medicines may be given to reduce the size of the uterus (contract) if there is a lot of bleeding.  If you have Rh negative blood and your baby was Rh positive, you will need a Rho (D)  immune globulin shot. This shot will protect any future baby from having Rh blood problems in future pregnancies.  You may be confined to bed rest. This means you should stay in bed and only get up to use the bathroom. HOME CARE INSTRUCTIONS   Rest as directed by your health care provider.  Restrict activity as directed by your health care provider. You may be allowed to continue light activity if curettage was not done but you require further treatment.  Keep track of the number of pads you use each day. Keep track of how soaked (saturated) they are. Record this information.  Do not  use tampons.  Do not douche or have sexual intercourse until approved by your health care provider.  Keep all follow-up appointments for reevaluation and continuing management.  Only take over-the-counter or prescription medicines for pain, fever, or discomfort as directed by your health care provider.  Take antibiotic medicine as directed by your health care provider. Make sure you finish it even if you start to feel better. SEEK IMMEDIATE MEDICAL CARE IF:   You experience severe cramps in your stomach, back, or abdomen.  You have an unexplained temperature (make sure to record these temperatures).  You pass large clots or tissue (save these for your health care provider to inspect).  Your bleeding increases.  You become light-headed, weak, or have fainting episodes. MAKE SURE YOU:   Understand these instructions.  Will watch your condition.  Will get help right away if you are not doing well or get worse.   This information is not intended to   replace advice given to you by your health care provider. Make sure you discuss any questions you have with your health care provider.   Document Released: 05/21/2005 Document Revised: 06/11/2014 Document Reviewed: 12/18/2012 Elsevier Interactive Patient Education 2016 [employer]  

## 2015-12-05 NOTE — Telephone Encounter (Signed)
LM for patient to return call to MAU. HCG results are negative. Patient needs to be informed. Had recent SAB and continued bleeding, seen in MAU this morning, but discharge prior to hCG results since they were not required to be run STAT given confirmed SAB.   Marny LowensteinJulie N Wenzel, PA-C 12/05/2015 7:11 AM

## 2015-12-05 NOTE — MAU Note (Signed)
Pt here with c/o continued vaginal bleeding after miscarriage (7 weeks ago). Mostly brown blood for the last week, has odor now. Thought she may have infection and used Metrogel on Wednesday.

## 2015-12-13 ENCOUNTER — Ambulatory Visit (INDEPENDENT_AMBULATORY_CARE_PROVIDER_SITE_OTHER): Payer: Medicaid Other | Admitting: Obstetrics

## 2015-12-13 ENCOUNTER — Encounter: Payer: Self-pay | Admitting: Obstetrics

## 2015-12-13 VITALS — BP 142/91 | HR 76 | Temp 97.9°F | Wt 187.0 lb

## 2015-12-13 DIAGNOSIS — O039 Complete or unspecified spontaneous abortion without complication: Secondary | ICD-10-CM | POA: Diagnosis not present

## 2015-12-13 DIAGNOSIS — Z30011 Encounter for initial prescription of contraceptive pills: Secondary | ICD-10-CM | POA: Diagnosis not present

## 2015-12-13 DIAGNOSIS — Z3009 Encounter for other general counseling and advice on contraception: Secondary | ICD-10-CM

## 2015-12-13 MED ORDER — NORETHINDRONE-ETH ESTRADIOL 1-35 MG-MCG PO TABS: 1.0000 | ORAL_TABLET | Freq: Every day | ORAL | Status: DC

## 2015-12-14 ENCOUNTER — Encounter: Payer: Self-pay | Admitting: Obstetrics

## 2015-12-14 NOTE — Progress Notes (Signed)
Patient ID: Christina Greene, female   DOB: [birth year], 35 y.o.   MRN: [id number]  Chief Complaint  Patient presents with  . Gynecologic Exam    Patient is in the office for follow up to SAB. Patient was given medication medication 3/23. Patient bled after taking the medication- and reports today as the first day she has not bled. Patient was seen at the hospital for continued bleeding- 7/3 and she was cleared.    HPI Christina Greene is a 35 y.o. female.  S/P SAB, complete.  Presents for F/U.  No complaints.  HPI  Past Medical History  Diagnosis Date  . Ectopic pregnancy   . Ovarian cyst   . Abnormal Pap smear     colpo ok  . Depression     doing ok    Past Surgical History  Procedure Laterality Date  . Dilation and curettage of uterus    . Laparotomy      ruptured ectopic, rt tube; ovarian cyst  . Wisdom tooth extraction    . Nasal fracture surgery  2002  . Hand surgery Right   . Laparoscopic appendectomy N/A 10/04/2013    Procedure: APPENDECTOMY LAPAROSCOPIC;  Surgeon: Adolph Pollack, MD;  Location: [facility];  Service: General;  Laterality: N/A;  . Appendectomy      Family History  Problem Relation Age of Onset  . Hypertension Mother   . Hypothyroidism Mother   . Diabetes Father   . Liver cancer Father   . Hepatitis Father   . Other Neg Hx     Social History Social History  Substance Use Topics  . Smoking status: Former Smoker -- 4 years    Types: Cigars  . Smokeless tobacco: Never Used     Comment: black and milds  . Alcohol Use: 0.0 oz/week    0 Standard drinks or equivalent per week     Comment: occ    No Known Allergies  Current Outpatient Prescriptions  Medication Sig Dispense Refill  . docusate sodium (COLACE) 100 MG capsule Take 2 capsules (200 mg total) by mouth at bedtime. 120 capsule 1  . cyclobenzaprine (FLEXERIL) 5 MG tablet Take 5 mg by mouth 3 (three) times daily as needed for muscle spasms. Reported on 12/13/2015    .  norethindrone-ethinyl estradiol 1/35 (ORTHO-NOVUM 1/35, 28,) tablet Take 1 tablet by mouth daily. 1 Package 11   No current facility-administered medications for this visit.    Review of Systems Review of Systems Constitutional: negative for fatigue and weight loss Respiratory: negative for cough and wheezing Cardiovascular: negative for chest pain, fatigue and palpitations Gastrointestinal: negative for abdominal pain and change in bowel habits Genitourinary:negative Integument/breast: negative for nipple discharge Musculoskeletal:negative for myalgias Neurological: negative for gait problems and tremors Behavioral/Psych: negative for abusive relationship, depression Endocrine: negative for temperature intolerance     Blood pressure 142/91, pulse 76, temperature 97.9 F (36.6 C), weight 187 lb (84.823 kg), unknown if currently breastfeeding.  Physical Exam Physical Exam General:   alert  Skin:   no rash or abnormalities  Lungs:   clear to auscultation bilaterally  Heart:   regular rate and rhythm, S1, S2 normal, no murmur, click, rub or gallop  Breasts:   normal without suspicious masses, skin or nipple changes or axillary nodes  Abdomen:  normal findings: no organomegaly, soft, non-tender and no hernia  Pelvis:  External genitalia: normal general appearance Urinary system: urethral meatus normal and bladder without fullness, nontender Vaginal: normal without tenderness,  induration or masses Cervix: normal appearance Adnexa: normal bimanual exam Uterus: anteverted and non-tender, normal size     Data Reviewed Labs  Assessment     SAB, complete  Contraceptive counseling and advice.  Wants OCP's.    Plan    Ortho Novum 1/35 Rx  F/U in 3 months  No orders of the defined types were placed in this encounter.   Meds ordered this encounter  Medications  . norethindrone-ethinyl estradiol 1/35 (ORTHO-NOVUM 1/35, 28,) tablet    Sig: Take 1 tablet by mouth daily.     Dispense:  1 Package    Refill:  11

## 2015-12-15 NOTE — Telephone Encounter (Signed)
See telephone note for this encounter.

## 2016-01-03 ENCOUNTER — Telehealth: Payer: Self-pay | Admitting: *Deleted

## 2016-01-03 NOTE — Telephone Encounter (Signed)
[name] is calling about our mutual patient. She is seeing her weekly for counseling. She is requesting that her provider reach out to her regarding starting an antidepressant.  The patient is emotional unstable at this time with crying over her multiple miscarriages. She has mood instability and is now being counseled regarding her grief and loss and is working on her cycle stressors. She finds it difficult to function at times without crying.

## 2016-01-20 ENCOUNTER — Telehealth: Payer: Self-pay | Admitting: *Deleted

## 2016-01-20 DIAGNOSIS — B9689 Other specified bacterial agents as the cause of diseases classified elsewhere: Secondary | ICD-10-CM

## 2016-01-20 DIAGNOSIS — T3695XA Adverse effect of unspecified systemic antibiotic, initial encounter: Secondary | ICD-10-CM

## 2016-01-20 DIAGNOSIS — N760 Acute vaginitis: Principal | ICD-10-CM

## 2016-01-20 DIAGNOSIS — B379 Candidiasis, unspecified: Secondary | ICD-10-CM

## 2016-01-20 MED ORDER — METRONIDAZOLE 500 MG PO TABS
500.0000 mg | ORAL_TABLET | Freq: Two times a day (BID) | ORAL | 0 refills | Status: DC
Start: 2016-01-20 — End: 2016-02-02

## 2016-01-20 MED ORDER — TERCONAZOLE 0.8 % VA CREA: 1.0000 | TOPICAL_CREAM | Freq: Every day | VAGINAL | 0 refills | Status: DC

## 2016-01-20 NOTE — Telephone Encounter (Signed)
Patient is requesting BV and yeast treatment. Patient states she normally gets BV treatment and follows with yeast treatment.  Per Dr Clearance CootsHarperRip Harbour- Ok to call Rx to pharmacy.

## 2016-02-02 ENCOUNTER — Ambulatory Visit (INDEPENDENT_AMBULATORY_CARE_PROVIDER_SITE_OTHER): Payer: Medicaid Other | Admitting: Podiatry

## 2016-02-02 ENCOUNTER — Ambulatory Visit (INDEPENDENT_AMBULATORY_CARE_PROVIDER_SITE_OTHER): Payer: Medicaid Other

## 2016-02-02 DIAGNOSIS — M7671 Peroneal tendinitis, right leg: Secondary | ICD-10-CM

## 2016-02-02 DIAGNOSIS — M722 Plantar fascial fibromatosis: Secondary | ICD-10-CM

## 2016-02-02 MED ORDER — MELOXICAM 15 MG PO TABS: 15.0000 mg | ORAL_TABLET | Freq: Every day | ORAL | 3 refills | Status: DC

## 2016-02-02 MED ORDER — METHYLPREDNISOLONE 4 MG PO TBPK: ORAL_TABLET | ORAL | 0 refills | Status: DC

## 2016-02-02 NOTE — Progress Notes (Signed)
   Subjective:    Patient ID: Christina Greene, female    DOB: [birth year], 35 y.o.   MRN: [id number]  HPI: She presents today with a chief complaint of pain to the lateral aspect and dorsal lateral aspect of her right foot. She states this been going on for several weeks now. Denies any trauma.    Review of Systems  All other systems reviewed and are negative.      Objective:   Physical Exam: Vital signs are stable alert and oriented 3 pulses are palpable. Neurologic sensorium is intact to reflex intact muscle strength is normal bilateral. Orthopedic evaluation was trace pain on palpation fourth and fifth metatarsal cuboid articulation. She has some tenderness on palpation of the fifth metatarsal head of the right foot. She also has pain on palpation medial calcaneal tubercle of the right heel. Radiographs taken today of the right foot and he demonstrated normal osseous architecture with soft tissue increase in density at the plantar fascia calcaneal insertion site no fractures are identified.      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  Plan: I injected the area today with Kenalog and local anesthetic started her on a Medrol Dosepak to be followed by meloxicam. Placed her plantar fascia brace and a night splint. Discussed appropriate shoe gear stretching exercises ice therapy and shoe gear modification.

## 2016-02-02 NOTE — Patient Instructions (Signed)

## 2016-03-08 ENCOUNTER — Ambulatory Visit: Payer: Medicaid Other | Admitting: Podiatry

## 2016-03-15 ENCOUNTER — Encounter: Payer: Self-pay | Admitting: Obstetrics

## 2016-03-15 ENCOUNTER — Other Ambulatory Visit (HOSPITAL_COMMUNITY)
Admission: RE | Admit: 2016-03-15 | Discharge: 2016-03-15 | Disposition: A | Discharge status: Home / Self Care | Payer: Medicaid Other | Source: Ambulatory Visit | Attending: Obstetrics | Admitting: Obstetrics

## 2016-03-15 ENCOUNTER — Ambulatory Visit (INDEPENDENT_AMBULATORY_CARE_PROVIDER_SITE_OTHER): Payer: Medicaid Other | Admitting: Obstetrics

## 2016-03-15 ENCOUNTER — Encounter: Payer: Self-pay | Admitting: *Deleted

## 2016-03-15 ENCOUNTER — Ambulatory Visit: Payer: Medicaid Other | Admitting: Obstetrics

## 2016-03-15 VITALS — BP 135/85 | HR 89 | Ht 66.0 in | Wt 185.0 lb

## 2016-03-15 DIAGNOSIS — Z3040 Encounter for surveillance of contraceptives, unspecified: Secondary | ICD-10-CM

## 2016-03-15 DIAGNOSIS — Z113 Encounter for screening for infections with a predominantly sexual mode of transmission: Secondary | ICD-10-CM | POA: Insufficient documentation

## 2016-03-15 DIAGNOSIS — Z1151 Encounter for screening for human papillomavirus (HPV): Secondary | ICD-10-CM | POA: Diagnosis present

## 2016-03-15 DIAGNOSIS — Z01419 Encounter for gynecological examination (general) (routine) without abnormal findings: Secondary | ICD-10-CM | POA: Insufficient documentation

## 2016-03-15 DIAGNOSIS — N760 Acute vaginitis: Secondary | ICD-10-CM | POA: Diagnosis present

## 2016-03-15 DIAGNOSIS — Z30015 Encounter for initial prescription of vaginal ring hormonal contraceptive: Secondary | ICD-10-CM | POA: Diagnosis not present

## 2016-03-15 MED ORDER — ETONOGESTREL-ETHINYL ESTRADIOL 0.12-0.015 MG/24HR VA RING: 1.0000 | VAGINAL_RING | VAGINAL | 11 refills | Status: AC

## 2016-03-15 NOTE — Progress Notes (Signed)
Subjective:        Christina Greene is a 35 y.o. female here for a routine exam.  Current complaints: None.    Personal health questionnaire:  Is patient Christina Greene, have a family history of breast and/or ovarian cancer: no Is there a family history of uterine cancer diagnosed at age < 76, gastrointestinal cancer, urinary tract cancer, family member who is a Personnel officer syndrome-associated carrier: no Is the patient overweight and hypertensive, family history of diabetes, personal history of gestational diabetes, preeclampsia or PCOS: no Is patient over 47, have PCOS,  family history of premature CHD under age 78, diabetes, smoke, have hypertension or peripheral artery disease:  no At any time, has a partner hit, kicked or otherwise hurt or frightened you?: no Over the past 2 weeks, have you felt down, depressed or hopeless?: no Over the past 2 weeks, have you felt little interest or pleasure in doing things?:no   Gynecologic History Patient's last menstrual period was 02/19/2016 (approximate). Contraception: NuvaRing vaginal inserts Last Pap: 2016. Results were: abnormal Last mammogram: n/a. Results were: n/a  Obstetric History OB History  Gravida Para Term Preterm AB Living  5 1 1  0 4 1  SAB TAB Ectopic Multiple Live Births  3 0 1 0 1    # Outcome Date GA Lbr Len/2nd Weight Sex Delivery Anes PTL Lv  5 SAB 10/25/15 [redacted]w[redacted]d         4 SAB 05/04/12 [redacted]w[redacted]d            Birth Comments: System Generated. Please review and update pregnancy details.  3 Ectopic 2005          2 Term 10/23/02    M Vag-Spont EPI N LIV  1 SAB 2002              Past Medical History:  Diagnosis Date  . Abnormal Pap smear    colpo ok  . Depression    doing ok  . Ectopic pregnancy   . Ovarian cyst     Past Surgical History:  Procedure Laterality Date  . APPENDECTOMY    . DILATION AND CURETTAGE OF UTERUS    . HAND SURGERY Right   . INDUCED ABORTION    . LAPAROSCOPIC APPENDECTOMY N/A 10/04/2013   Procedure: APPENDECTOMY LAPAROSCOPIC;  Surgeon: Adolph Pollack, MD;  Location: [facility];  Service: General;  Laterality: N/A;  . LAPAROTOMY     ruptured ectopic, rt tube; ovarian cyst  . NASAL FRACTURE SURGERY  2002  . WISDOM TOOTH EXTRACTION       Current Outpatient Prescriptions:  .  etonogestrel-ethinyl estradiol (NUVARING) 0.12-0.015 MG/24HR vaginal ring, Place 1 each vaginally every 28 (twenty-eight) days. Insert vaginally and leave in place for 3 consecutive weeks, then remove for 1 week., Disp: 1 each, Rfl: 11 .  meloxicam (MOBIC) 15 MG tablet, Take 1 tablet (15 mg total) by mouth daily., Disp: 30 tablet, Rfl: 3 No Known Allergies  Social History  Substance Use Topics  . Smoking status: Former Smoker    Years: 4.00    Types: Cigars  . Smokeless tobacco: Never Used     Comment: black and milds  . Alcohol use 0.0 oz/week     Comment: occ    Family History  Problem Relation Age of Onset  . Hypertension Mother   . Hypothyroidism Mother   . Diabetes Father   . Liver cancer Father   . Hepatitis Father   . Other Neg Hx  Review of Systems  Constitutional: negative for fatigue and weight loss Respiratory: negative for cough and wheezing Cardiovascular: negative for chest pain, fatigue and palpitations Gastrointestinal: negative for abdominal pain and change in bowel habits Musculoskeletal:negative for myalgias Neurological: negative for gait problems and tremors Behavioral/Psych: negative for abusive relationship, depression Endocrine: negative for temperature intolerance   Genitourinary:negative for abnormal menstrual periods, genital lesions, hot flashes, sexual problems and vaginal discharge Integument/breast: negative for breast lump, breast tenderness, nipple discharge and skin lesion(s)    Objective:       BP 135/85   Pulse 89   Ht 5\' 6"  (1.676 m)   Wt 185 lb (83.9 kg)   LMP 02/19/2016 (Approximate)   Breastfeeding? No   BMI 29.86 kg/m  General:    alert  Skin:   no rash or abnormalities  Lungs:   clear to auscultation bilaterally  Heart:   regular rate and rhythm, S1, S2 normal, no murmur, click, rub or gallop  Breasts:   normal without suspicious masses, skin or nipple changes or axillary nodes  Abdomen:  normal findings: no organomegaly, soft, non-tender and no hernia  Pelvis:  External genitalia: normal general appearance Urinary system: urethral meatus normal and bladder without fullness, nontender Vaginal: normal without tenderness, induration or masses Cervix: normal appearance Adnexa: normal bimanual exam Uterus: anteverted and non-tender, normal size   Lab Review Urine pregnancy test Labs reviewed yes Radiologic studies reviewed no  50% of 20 min visit spent on counseling and coordination of care.   Assessment:    Healthy female exam.    Plan:    Education reviewed: low fat, low cholesterol diet, safe sex/STD prevention, self breast exams and weight bearing exercise. Contraception: NuvaRing vaginal inserts. Follow up in: 1 year.   Meds ordered this encounter  Medications  . DISCONTD: etonogestrel-ethinyl estradiol (NUVARING) 0.12-0.015 MG/24HR vaginal ring    Sig: Place 1 each vaginally every 28 (twenty-eight) days. Insert vaginally and leave in place for 3 consecutive weeks, then remove for 1 week.  . etonogestrel-ethinyl estradiol (NUVARING) 0.12-0.015 MG/24HR vaginal ring    Sig: Place 1 each vaginally every 28 (twenty-eight) days. Insert vaginally and leave in place for 3 consecutive weeks, then remove for 1 week.    Dispense:  1 each    Refill:  11   No orders of the defined types were placed in this encounter.     Patient ID: Christina Greene, female   DOB: Aug 05, 1980, 35 y.o.   MRN: [id number]

## 2016-03-16 LAB — CERVICOVAGINAL ANCILLARY ONLY
Chlamydia: NEGATIVE
Neisseria Gonorrhea: NEGATIVE
TRICH (WINDOWPATH): NEGATIVE

## 2016-03-19 LAB — CYTOLOGY - PAP
Diagnosis: NEGATIVE
HPV: NOT DETECTED

## 2016-03-19 LAB — CERVICOVAGINAL ANCILLARY ONLY
Bacterial vaginitis: NEGATIVE
CANDIDA VAGINITIS: NEGATIVE

## 2016-03-20 ENCOUNTER — Encounter: Payer: Self-pay | Admitting: Podiatry

## 2016-03-20 ENCOUNTER — Ambulatory Visit (INDEPENDENT_AMBULATORY_CARE_PROVIDER_SITE_OTHER): Payer: Medicaid Other | Admitting: Podiatry

## 2016-03-20 DIAGNOSIS — M778 Other enthesopathies, not elsewhere classified: Secondary | ICD-10-CM

## 2016-03-20 DIAGNOSIS — M722 Plantar fascial fibromatosis: Secondary | ICD-10-CM

## 2016-03-20 DIAGNOSIS — M779 Enthesopathy, unspecified: Secondary | ICD-10-CM

## 2016-03-20 MED ORDER — MELOXICAM 15 MG PO TABS: 15.0000 mg | ORAL_TABLET | Freq: Every day | ORAL | 3 refills | Status: AC

## 2016-03-20 NOTE — Progress Notes (Signed)
She presents today for follow-up of plantar fasciitis to her right heel. She states it is doing much better than it was.  Objective: Vital signs are stable alert and oriented 3. Pulses are palpable. She has pain on palpation medial calcaneal tubercle of the right heel. Nowhere near as tender as it was previously.  Assessment: Plantar fasciitis resolving right.  Plan: Reinjected the right heel today. Encouraged her to continue conservative therapies plan a partial brace night splint and oral medication. Follow up with her in 1 month.

## 2016-04-17 ENCOUNTER — Ambulatory Visit: Payer: Medicaid Other | Admitting: Podiatry

## 2016-12-24 ENCOUNTER — Telehealth: Payer: Self-pay

## 2016-12-24 DIAGNOSIS — N898 Other specified noninflammatory disorders of vagina: Secondary | ICD-10-CM

## 2016-12-24 MED ORDER — FLUCONAZOLE 150 MG PO TABS: 150.0000 mg | ORAL_TABLET | Freq: Once | ORAL | 0 refills | Status: AC

## 2016-12-24 NOTE — Telephone Encounter (Signed)
Returned call and pt requested diflucan to be sent to pharmacy, states she has been having a lot of vaginal itching after using certain wipes. Sent per provider approval.

## 2017-01-08 ENCOUNTER — Telehealth: Payer: Self-pay | Admitting: *Deleted

## 2017-01-08 NOTE — Telephone Encounter (Signed)
Pt called to office stating she spoke with someone 2 weeks ago and was given Diflucan for yeast. Pt states that she has yeast again, would like another Diflucan.  Attempt to contact pt.  LM on VM making pt aware that she will need an appt since she was treated recently in order to determine treatment needed. Pt advised to call office and schedule appt.

## 2017-02-08 ENCOUNTER — Ambulatory Visit: Payer: Self-pay | Admitting: Certified Nurse Midwife

## 2017-06-26 ENCOUNTER — Telehealth: Payer: Self-pay | Admitting: *Deleted

## 2017-06-26 NOTE — Telephone Encounter (Signed)
Called pt to schedule Annual/pap phone number listed has been changed and no new number in the system for the patient.Christina Greene..Christina Greene

## 2017-08-15 ENCOUNTER — Encounter: Payer: Self-pay | Admitting: *Deleted
